# Patient Record
Sex: Female | Born: 1965 | Race: White | Hispanic: No | Marital: Single | Smoking: Never smoker
Health system: Southern US, Community
[De-identification: ages and names within clinical notes are randomized; demographics above are authoritative.]

## PROBLEM LIST (undated history)

## (undated) DIAGNOSIS — Z800 Family history of malignant neoplasm of digestive organs: Secondary | ICD-10-CM

## (undated) DIAGNOSIS — L570 Actinic keratosis: Secondary | ICD-10-CM

## (undated) DIAGNOSIS — Z1371 Encounter for nonprocreative screening for genetic disease carrier status: Secondary | ICD-10-CM

## (undated) HISTORY — DX: Actinic keratosis: L57.0

## (undated) HISTORY — DX: Family history of malignant neoplasm of digestive organs: Z80.0

---
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
## 1990-08-28 HISTORY — PX: TUBAL LIGATION: SHX77

## 2007-10-02 ENCOUNTER — Ambulatory Visit: Payer: Self-pay | Admitting: Unknown Physician Specialty

## 2009-12-22 ENCOUNTER — Ambulatory Visit: Payer: Self-pay | Admitting: Unknown Physician Specialty

## 2011-01-31 ENCOUNTER — Ambulatory Visit: Payer: Self-pay | Admitting: Unknown Physician Specialty

## 2011-02-14 ENCOUNTER — Ambulatory Visit: Payer: Self-pay | Admitting: Unknown Physician Specialty

## 2012-03-15 ENCOUNTER — Ambulatory Visit: Payer: Self-pay | Admitting: Unknown Physician Specialty

## 2015-04-05 ENCOUNTER — Encounter: Payer: Self-pay | Admitting: Physical Therapy

## 2015-04-05 ENCOUNTER — Ambulatory Visit: Payer: BC Managed Care – PPO | Attending: Nurse Practitioner | Admitting: Physical Therapy

## 2015-04-05 VITALS — BP 122/88

## 2015-04-05 DIAGNOSIS — Z7409 Other reduced mobility: Secondary | ICD-10-CM | POA: Insufficient documentation

## 2015-04-05 DIAGNOSIS — R279 Unspecified lack of coordination: Secondary | ICD-10-CM | POA: Insufficient documentation

## 2015-04-05 DIAGNOSIS — M791 Myalgia: Secondary | ICD-10-CM | POA: Diagnosis present

## 2015-04-05 DIAGNOSIS — M609 Myositis, unspecified: Secondary | ICD-10-CM | POA: Diagnosis present

## 2015-04-05 DIAGNOSIS — IMO0001 Reserved for inherently not codable concepts without codable children: Secondary | ICD-10-CM

## 2015-04-05 NOTE — Patient Instructions (Signed)
Bring in a profile pic of work station to next visit.                   Modify car seat with towels.                          Lifting on exhale with squat position.                                  Preserve the function of your pelvic floor, abdomen, and back.              Avoid decreased straining of abdominal/pelvic floor muscles with less  slouching,  holding your breath with lifting/bowel movements)                                                     FUNCTIONAL POSTURES

## 2015-04-05 NOTE — Therapy (Signed)
[place] MAIN [facility] SERVICES [address] [place], [place], [address] Phone: [phone]   Fax:  [phone]  Physical Therapy Evaluation  Patient Details  Name: Michele Rodriguez MRN: [id number] Date of Birth: 02-05-1966 Referring Provider:  Josie Saunders, NP  Encounter Date: 04/05/2015      PT End of Session - 04/05/15 1243    Visit Number 1   Number of Visits 12   Date for PT Re-Evaluation 06/28/15   PT Start Time 1117   PT Stop Time 1220   PT Time Calculation (min) 63 min   Activity Tolerance Patient tolerated treatment well;No increased pain   Behavior During Therapy [facility] for tasks assessed/performed      History reviewed. No pertinent past medical history.  Past Surgical History  Procedure Laterality Date  . Foot surgery Bilateral 1983    to align big toe, pt states it might been "bunion surgery"   . Knee arthroscopy Bilateral 1983    "to align knee cap" pt states  . Tubal ligation  1992    Filed Vitals:   04/05/15 1135  BP: 122/88    Visit Diagnosis:  Lack of coordination - Plan: PT plan of care cert/re-cert  Mobility impaired - Plan: PT plan of care cert/re-cert  Myalgia and myositis - Plan: PT plan of care cert/re-cert      Subjective Assessment - 04/05/15 1135    Subjective Pt began experiencing urinary leakage with sneezing 2 years ago. Pt states it is not worrisome to her because it occurs 1-2x/year. Pt does not wear pads. Pt used to be into body building and doing lots of sit-ups (150/day, 6 days/week) for over 10 years. Pt had stopped working out due to prioritizing her time to home maintenance (yard work). Pt's FT job is sedentary and she is sitting 10 hrs /day including a 2 hr roundtrip commute. 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 Daily bowel movements w/ Type 3-4 on Bristol Scale.  Daily intake: 1-2 coffee/day, 2 (16 fl oz )  water/day. Denied urinary frequency, urgency but does cater to toileting trips around meetings.Denied dyspareunia.       Pertinent History Hx of 2 vaginal births, Hx of falls on coccyx as a child.   Currently in Pain? No/denies            [facility] PT Assessment - 04/05/15 1152    Assessment   Medical Diagnosis SUI   Precautions   Precautions None   Restrictions   Weight Bearing Restrictions No   Balance Screen   Has the patient fallen in the past 6 months No   Coordination   Gross Motor Movements are Fluid and Coordinated --  lumbopelvic instability w/ ASLR,    Fine Motor Movements are Fluid and Coordinated --  abdominal straining w/ cue for bowel movement and "kegel"   Squat   Comments w/ lifting with breathholding,  narrow BOS    Posture/Postural Control   Posture Comments reclined and crossed legged  diaphragmatic breathing/ pelvic floor ROM achieved   ROM / Strength   AROM / PROM / Strength --  hip abd 4/5 bil, 5/5 w/ deep core engaged   AROM   Overall AROM  --  WFL    Palpation   Spinal mobility 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   increased mm tensions upper back   SI assessment  no asymmetries  Bed Mobility   Bed Mobility --  crunch technique, ablet to demo log rolling w/ minor cuing                   [facility] Adult PT Treatment/Exercise - 04/05/15 1152    Self-Care   Self-Care --  towel modification in car seat   Therapeutic Activites    Therapeutic Activities --  lifting bmechanics/ exhale 3 reps, log rolling, toileting   Neuro Re-ed    Neuro Re-ed Details  deep core system pre activation in functional activities, proper sitting posture   Manual Therapy   Joint Mobilization grade III PA on SP T5-6   no tenderness noted, increased mobility post-Tx   Soft tissue mobilization on midback mm   decreased tensions post Tx                PT Education - 04/05/15 1241    Education provided Yes   Education Details HEP, POC, anatomy/physiology  with pcitures shown, body mechanics, ways to decreased downward pressure on pelvic floor    Person(s) Educated Patient   Methods Explanation;Demonstration;Tactile cues;Verbal cues;Handout   Comprehension Verbalized understanding;Returned demonstration             PT Long Term Goals - 04/05/15 1250    PT LONG TERM GOAL #1   Title Pt will demo decreased upper/mid back tensions and increased mobility of thoracic segments across 2 visits in order to apply proper use of deep core mm for lifting heavy objects.    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   Time 12   Period Weeks   Status New   PT LONG TERM GOAL #3   Title Pt will demo proper sitting posture & logrolling technique without cuing across 2 visits in order to demo IND with proper alignment and posture to minimize worsening of Sx.    Time 12   Period Weeks   Status New   PT LONG TERM GOAL #4   Title Pt will have a increased score from 91% to > 95% in order to demo improved QOL.    Time 12   Period Weeks   Status New               Plan - 04/05/15 1244    Clinical Impression Statement Pt is a 49 yo female who c/o SUI and has S & Sx that are consistent w/ poor coordination of deep core mm, poor toileting/ lifting/ OOB/sitting body mechanics that predispose downward forces onto pelvic floor mm. Pt also demo weak hip strength, increased upper back mm tensions, decreased thoracic segmental mobility. These deficits impact her job duties and predisposes her to higher risk for injuries at home and at work.    Pt will benefit from skilled therapeutic intervention in order to improve on the following deficits Decreased coordination;Decreased activity tolerance;Decreased strength;Increased muscle spasms;Improper body mechanics;Postural  dysfunction;Hypomobility;Decreased endurance;Decreased range of motion;Decreased mobility;Decreased knowledge of precautions   Rehab Potential Good   Clinical Impairments Affecting Rehab Potential Long hx of performing repetitive sit-ups, sedentary lifestyle   PT Frequency 1x / week   PT Duration 12 weeks   PT Treatment/Interventions ADLs/Self Care Home Management;Biofeedback;Cryotherapy;Electrical Stimulation;Iontophoresis 4mg /ml Dexamethasone;Moist Heat;Traction;Prosthetic Training;Gait training;Stair training;Functional mobility training;Therapeutic activities;Therapeutic exercise;Balance training;Neuromuscular re-education;Patient/family  education;Dry needling;Energy conservation;Manual techniques   PT Next Visit Plan intravaginal assessment, dynamic stabilization   Consulted and Agree with Plan of Care Patient         Problem List There are no active problems to display for this patient.   Jerl Mina  ,PT, DPT, E-RYT  04/05/2015, 1:02 PM  [place] MAIN [facility] SERVICES [address] [place], [place], [address] Phone: [phone]   Fax:  [phone]

## 2015-04-16 ENCOUNTER — Ambulatory Visit: Payer: BC Managed Care – PPO | Admitting: Physical Therapy

## 2015-04-23 ENCOUNTER — Ambulatory Visit: Payer: BC Managed Care – PPO | Admitting: Physical Therapy

## 2015-04-26 NOTE — Therapy (Signed)
[place] MAIN [facility] SERVICES [address] [place], [place], [address] Phone: [phone]   Fax:  [phone]  Patient Details  Name: Michele Rodriguez MRN: [id number] Date of Birth: 12/08/1965 Referring Provider:  Josie Saunders, NP  Encounter Date: 04/21/2015  Since eval, pt cancelled one appt and no showed to her last appt. PT called pt on 04/26/15 and pt feels she is not interested in pursuing PT at this time due to finances and other medical appts.  PT provided a resource to a book and an exercise program for deep core strengthening exercises that may be more cost-effective options for pt.  (Baby Bod by Brendolyn Patty and [email or web address])   Thank you for your referral!    Jerl Mina ,PT, DPT, E-RYT  04/26/2015, 4:00 PM  [place] MAIN [facility] SERVICES [address] [place], [place], [address] Phone: [phone]   Fax:  [phone]

## 2015-04-30 ENCOUNTER — Encounter: Payer: BC Managed Care – PPO | Admitting: Physical Therapy

## 2015-05-07 ENCOUNTER — Encounter: Payer: BC Managed Care – PPO | Admitting: Physical Therapy

## 2015-05-13 ENCOUNTER — Encounter: Payer: Self-pay | Admitting: *Deleted

## 2015-05-14 ENCOUNTER — Encounter: Payer: BC Managed Care – PPO | Admitting: Physical Therapy

## 2015-05-14 ENCOUNTER — Ambulatory Visit: Payer: BC Managed Care – PPO | Admitting: Anesthesiology

## 2015-05-14 ENCOUNTER — Encounter
Admission: RE | Disposition: A | Discharge status: Home / Self Care | Payer: Self-pay | Source: Ambulatory Visit | Attending: Gastroenterology

## 2015-05-14 ENCOUNTER — Ambulatory Visit
Admission: RE | Admit: 2015-05-14 | Discharge: 2015-05-14 | Disposition: A | Discharge status: Home / Self Care | Payer: BC Managed Care – PPO | Source: Ambulatory Visit | Attending: Gastroenterology | Admitting: Gastroenterology

## 2015-05-14 DIAGNOSIS — E039 Hypothyroidism, unspecified: Secondary | ICD-10-CM | POA: Insufficient documentation

## 2015-05-14 DIAGNOSIS — Z8371 Family history of colonic polyps: Secondary | ICD-10-CM | POA: Insufficient documentation

## 2015-05-14 DIAGNOSIS — D122 Benign neoplasm of ascending colon: Secondary | ICD-10-CM | POA: Diagnosis not present

## 2015-05-14 DIAGNOSIS — Z1211 Encounter for screening for malignant neoplasm of colon: Secondary | ICD-10-CM | POA: Insufficient documentation

## 2015-05-14 DIAGNOSIS — D1779 Benign lipomatous neoplasm of other sites: Secondary | ICD-10-CM | POA: Diagnosis not present

## 2015-05-14 DIAGNOSIS — D127 Benign neoplasm of rectosigmoid junction: Secondary | ICD-10-CM | POA: Insufficient documentation

## 2015-05-14 DIAGNOSIS — Z800 Family history of malignant neoplasm of digestive organs: Secondary | ICD-10-CM | POA: Insufficient documentation

## 2015-05-14 DIAGNOSIS — K640 First degree hemorrhoids: Secondary | ICD-10-CM | POA: Diagnosis not present

## 2015-05-14 DIAGNOSIS — K5730 Diverticulosis of large intestine without perforation or abscess without bleeding: Secondary | ICD-10-CM | POA: Insufficient documentation

## 2015-05-14 HISTORY — PX: COLONOSCOPY WITH PROPOFOL: SHX5780

## 2015-05-14 SURGERY — COLONOSCOPY WITH PROPOFOL
Anesthesia: General

## 2015-05-14 MED ORDER — SODIUM CHLORIDE 0.9 % IV SOLN: INTRAVENOUS | Status: DC

## 2015-05-14 MED ORDER — SODIUM CHLORIDE 0.9 % IV SOLN
INTRAVENOUS | Status: DC
  Administered 2015-05-14: 1000 mL via INTRAVENOUS

## 2015-05-14 MED ORDER — MIDAZOLAM HCL 2 MG/2ML IJ SOLN
INTRAMUSCULAR | Status: DC | PRN
  Administered 2015-05-14: 1 mg via INTRAVENOUS

## 2015-05-14 MED ORDER — PROPOFOL INFUSION 10 MG/ML OPTIME
INTRAVENOUS | Status: DC | PRN
  Administered 2015-05-14: 120 ug/kg/min via INTRAVENOUS

## 2015-05-14 MED ORDER — PROPOFOL 10 MG/ML IV BOLUS
INTRAVENOUS | Status: DC | PRN
  Administered 2015-05-14: 30 mg via INTRAVENOUS

## 2015-05-14 MED ORDER — FENTANYL CITRATE (PF) 100 MCG/2ML IJ SOLN
INTRAMUSCULAR | Status: DC | PRN
  Administered 2015-05-14: 1 ug via INTRAVENOUS

## 2015-05-14 NOTE — Transfer of Care (Signed)
Immediate Anesthesia Transfer of Care Note  Patient: Michele Rodriguez  Procedure(s) Performed: Procedure(s): COLONOSCOPY WITH PROPOFOL (N/A)  Patient Location: PACU  Anesthesia Type:General  Level of Consciousness: awake  Airway & Oxygen Therapy: Patient Spontanous Breathing  Post-op Assessment: Report given to RN  Post vital signs: stable  Last Vitals:  Filed Vitals:   05/14/15 1220  BP: 95/56  Pulse: 62  Temp:   Resp: 15    Complications: No apparent anesthesia complications

## 2015-05-14 NOTE — Op Note (Signed)
[facility] Gastroenterology Patient Name: Michele Rodriguez Procedure Date: 05/14/2015 11:24 AM MRN: [id number] Account #: [id number] Date of Birth: [birth year] Admit Type: Outpatient Age: 49 Room: [facility] ENDO ROOM 3 Gender: Female Note Status: Finalized Procedure:         Colonoscopy Indications:       Colon cancer screening in patient at increased risk:                     Family history of colon polyps, Colon cancer screening in                     patient at increased risk: Colorectal cancer in multiple                     2nd degree relatives, This is the patient's first                     colonoscopy Patient Profile:   This is a 49 year old female. Providers:         Gerrit Heck. Rayann Heman, MD Referring MD:      Dani Gobble Huprich (Referring MD) Medicines:         Propofol per Anesthesia Complications:     No immediate complications. Procedure:         Pre-Anesthesia Assessment:                    - Prior to the procedure, a History and Physical was                     performed, and patient medications, allergies and                     sensitivities were reviewed. The patient's tolerance of                     previous anesthesia was reviewed.                    After obtaining informed consent, the colonoscope was                     passed under direct vision. Throughout the procedure, the                     patient's blood pressure, pulse, and oxygen saturations                     were monitored continuously. The Colonoscope was                     introduced through the anus and advanced to the the cecum,                     identified by appendiceal orifice and ileocecal valve. The                     colonoscopy was performed without difficulty. The patient                     tolerated the procedure well. The quality of the bowel                     preparation was excellent. Findings:      The perianal and digital rectal examinations were normal.  A 10 mm  polyp was found in the proximal ascending colon. The polyp was       flat. The polyp was removed with a saline injection-lift technique using       a hot snare. Resection and retrieval were complete.      A 2 mm polyp was found in the recto-sigmoid colon. The polyp was       sessile. The polyp was removed with a jumbo cold forceps. Resection and       retrieval were complete.      Many small and large-mouthed diverticula were found in the sigmoid       colon, in the descending colon, in the transverse colon and in the       ascending colon.      Internal hemorrhoids were found during retroflexion. The hemorrhoids       were Grade I (internal hemorrhoids that do not prolapse). Impression:        - One 10 mm polyp in the proximal ascending colon.                     Resected and retrieved.                    - One 2 mm polyp at the recto-sigmoid colon. Resected and                     retrieved.                    - Diverticulosis in the sigmoid colon, in the descending                     colon, in the transverse colon and in the ascending colon.                    - Internal hemorrhoids. Recommendation:    - Observe patient in GI recovery unit.                    - High fiber diet.                    - Continue present medications.                    - Await pathology results.                    - Repeat colonoscopy for surveillance based on pathology                     results, likely in 3 years.                    - Return to referring physician.                    - The findings and recommendations were discussed with the                     patient.                    - The findings and recommendations were discussed with the                     patient's family. Procedure Code(s): --- Professional ---  46503, Colonoscopy, flexible; with endoscopic mucosal                     resection                    45380, 66, Colonoscopy, flexible; with biopsy, single or                      multiple CPT copyright 2014 American Medical Association. All rights reserved. The codes documented in this report are preliminary and upon coder review may  be revised to meet current compliance requirements. Mellody Life, MD 05/14/2015 12:06:04 PM This report has been signed electronically. Number of Addenda: 0 Note Initiated On: 05/14/2015 11:24 AM Scope Withdrawal Time: 0 hours 15 minutes 58 seconds  Total Procedure Duration: 0 hours 20 minutes 34 seconds       [facility]

## 2015-05-14 NOTE — H&P (Signed)
  Primary Care Physician:  No PCP Per Patient  Pre-Procedure History & Physical: HPI:  Michele Rodriguez is a 49 y.o. female is here for an colonoscopy.   History reviewed. No pertinent past medical history.  Past Surgical History  Procedure Laterality Date  . Foot surgery Bilateral 1983    to align big toe, pt states it might been "bunion surgery"   . Knee arthroscopy Bilateral 1983    "to align knee cap" pt states  . Tubal ligation  1992    Prior to Admission medications   Medication Sig Start Date End Date Taking? Authorizing Provider  Multiple Vitamin (MULTIVITAMIN) capsule Take 1 capsule by mouth daily.   Yes Historical Provider, MD    Allergies as of 04/21/2015  . (Not on File)    Family History  Problem Relation Age of Onset  . Cancer Father     Social History   Social History  . Marital Status: Married    Spouse Name: N/A  . Number of Children: N/A  . Years of Education: N/A   Occupational History  . Not on file.   Social History Main Topics  . Smoking status: Never Smoker   . Smokeless tobacco: Never Used  . Alcohol Use: 6.6 oz/week    11 Cans of beer per week  . Drug Use: No  . Sexual Activity: Yes    Birth Control/ Protection: Surgical   Other Topics Concern  . Not on file   Social History Narrative     Physical Exam: BP 113/80 mmHg  Pulse 60  Temp(Src) 96.8 F (36 C) (Tympanic)  Resp 18  Ht 5\' 4"  (1.626 m)  Wt 72.576 kg (160 lb)  BMI 27.45 kg/m2  SpO2 100%  LMP  General:   Alert,  pleasant and cooperative in NAD Head:  Normocephalic and atraumatic. Neck:  Supple; no masses or thyromegaly. Lungs:  Clear throughout to auscultation.    Heart:  Regular rate and rhythm. Abdomen:  Soft, nontender and nondistended. Normal bowel sounds, without guarding, and without rebound.   Neurologic:  Alert and  oriented x4;  grossly normal neurologically.  Impression/Plan: Michele Rodriguez is here for an colonoscopy to be performed for screening  Risks,  benefits, limitations, and alternatives regarding  colonoscopy have been reviewed with the patient.  Questions have been answered.  All parties agreeable.   Josefine Class, MD  05/14/2015, 11:35 AM

## 2015-05-14 NOTE — Discharge Instructions (Signed)

## 2015-05-14 NOTE — Anesthesia Postprocedure Evaluation (Signed)
  Anesthesia Post-op Note  Patient: Michele Rodriguez  Procedure(s) Performed: Procedure(s): COLONOSCOPY WITH PROPOFOL (N/A)  Anesthesia type:General  Patient location: PACU  Post pain: Pain level controlled  Post assessment: Post-op Vital signs reviewed, Patient's Cardiovascular Status Stable, Respiratory Function Stable, Patent Airway and No signs of Nausea or vomiting  Post vital signs: Reviewed and stable  Last Vitals:  Filed Vitals:   05/14/15 1230  BP: 115/73  Pulse: 64  Temp:   Resp: 18    Level of consciousness: awake, alert  and patient cooperative  Complications: No apparent anesthesia complications

## 2015-05-14 NOTE — Anesthesia Preprocedure Evaluation (Signed)
Anesthesia Evaluation  Patient identified by MRN, date of birth, ID band Patient awake    Reviewed: Allergy & Precautions, NPO status , Patient's Chart, lab work & pertinent test results  History of Anesthesia Complications Negative for: history of anesthetic complications  Airway Mallampati: II       Dental no notable dental hx.    Pulmonary neg pulmonary ROS,    Pulmonary exam normal        Cardiovascular negative cardio ROS Normal cardiovascular exam     Neuro/Psych    GI/Hepatic Neg liver ROS,   Endo/Other  Hypothyroidism   Renal/GU negative Renal ROS     Musculoskeletal negative musculoskeletal ROS (+)   Abdominal Normal abdominal exam  (+)   Peds negative pediatric ROS (+)  Hematology negative hematology ROS (+)   Anesthesia Other Findings   Reproductive/Obstetrics negative OB ROS                             Anesthesia Physical Anesthesia Plan  ASA: II  Anesthesia Plan: General   Post-op Pain Management:    Induction: Intravenous  Airway Management Planned: Nasal Cannula  Additional Equipment:   Intra-op Plan:   Post-operative Plan:   Informed Consent: I have reviewed the patients History and Physical, chart, labs and discussed the procedure including the risks, benefits and alternatives for the proposed anesthesia with the patient or authorized representative who has indicated his/her understanding and acceptance.     Plan Discussed with: Surgeon  Anesthesia Plan Comments:         Anesthesia Quick Evaluation

## 2015-05-15 ENCOUNTER — Encounter: Payer: Self-pay | Admitting: Gastroenterology

## 2015-05-17 LAB — SURGICAL PATHOLOGY

## 2015-05-21 ENCOUNTER — Encounter: Payer: BC Managed Care – PPO | Admitting: Physical Therapy

## 2015-06-28 DIAGNOSIS — D126 Benign neoplasm of colon, unspecified: Secondary | ICD-10-CM | POA: Insufficient documentation

## 2018-01-22 ENCOUNTER — Encounter: Payer: Self-pay | Admitting: Obstetrics and Gynecology

## 2018-01-22 ENCOUNTER — Ambulatory Visit (INDEPENDENT_AMBULATORY_CARE_PROVIDER_SITE_OTHER): Payer: BC Managed Care – PPO | Admitting: Obstetrics and Gynecology

## 2018-01-22 VITALS — BP 110/70 | HR 65 | Ht 64.0 in | Wt 149.0 lb

## 2018-01-22 DIAGNOSIS — Z1231 Encounter for screening mammogram for malignant neoplasm of breast: Secondary | ICD-10-CM

## 2018-01-22 DIAGNOSIS — Z124 Encounter for screening for malignant neoplasm of cervix: Secondary | ICD-10-CM | POA: Diagnosis not present

## 2018-01-22 DIAGNOSIS — Z01419 Encounter for gynecological examination (general) (routine) without abnormal findings: Secondary | ICD-10-CM | POA: Diagnosis not present

## 2018-01-22 DIAGNOSIS — Z0000 Encounter for general adult medical examination without abnormal findings: Secondary | ICD-10-CM

## 2018-01-22 DIAGNOSIS — Z1239 Encounter for other screening for malignant neoplasm of breast: Secondary | ICD-10-CM

## 2018-01-22 NOTE — Progress Notes (Signed)
Gynecology Annual Exam  PCP: Michele Rodriguez, No Pcp Per  Chief Complaint:  Chief Complaint  Michele Rodriguez presents with  . Gynecologic Exam    History of Present Illness:Michele Rodriguez is a 52 y.o. G2P2002 presents for annual exam. The Michele Rodriguez has no complaints today.   Postmenopausal, no bleeding in 2 years  The Michele Rodriguez is sexually active. She denies dyspareunia.  The Michele Rodriguez does perform self breast exams.  There is notable family history of breast or ovarian cancer in her family. She does have a notable history of colon and pancreatic cancer  The Michele Rodriguez wears seatbelts: yes.   The Michele Rodriguez has regular exercise: not asked.    The Michele Rodriguez denies current symptoms of depression.     Review of Systems: ROS  Past Medical History:  History reviewed. No pertinent past medical history.  Past Surgical History:  Past Surgical History:  Procedure Laterality Date  . COLONOSCOPY WITH PROPOFOL N/A 05/14/2015   Procedure: COLONOSCOPY WITH PROPOFOL;  Surgeon: Josefine Class, MD;  Location: [facility] END[facility]COPY;  Service: Endoscopy;  Laterality: N/A;  . FOOT SURGERY Bilateral 1983   to align big toe, pt states it might been "bunion surgery"   . KNEE ARTHROSCOPY Bilateral 1983   "to align knee cap" pt states  . TUBAL LIGATION  1992    Gynecologic History:  No LMP recorded. Last Pap: Results were: 2014 NIL and HR HPV negative  Last mammogram: 2016 Results were: BI-RAD I  Obstetric History: [id number]  Family History:  Family History  Problem Relation Age of Onset  . Cancer Father   . Diabetes Father   . Hypotension Father   . Prostate cancer Father   . Lung cancer Sister   . Colon cancer Paternal Grandfather   . Pancreatic cancer Maternal Aunt   . Colon cancer Maternal Uncle   . Colon cancer Maternal Grandfather     Social History:  Social History   Socioeconomic History  . Marital status: Married    Spouse name: Not on file  . Number of children: Not on file  . Years of education: Not  on file  . Highest education level: Not on file  Occupational History  . Not on file  Social Needs  . Financial resource strain: Not on file  . Food insecurity:    Worry: Not on file    Inability: Not on file  . Transportation needs:    Medical: Not on file    Non-medical: Not on file  Tobacco Use  . Smoking status: Never Smoker  . Smokeless tobacco: Never Used  Substance and Sexual Activity  . Alcohol use: Yes    Alcohol/week: 6.6 oz    Types: 11 Cans of beer per week  . Drug use: No  . Sexual activity: Yes    Birth control/protection: Surgical  Lifestyle  . Physical activity:    Days per week: Not on file    Minutes per session: Not on file  . Stress: Not on file  Relationships  . Social connections:    Talks on phone: Not on file    Gets together: Not on file    Attends religious service: Not on file    Active member of club or organization: Not on file    Attends meetings of clubs or organizations: Not on file    Relationship status: Not on file  . Intimate partner violence:    Fear of current or ex partner: Not on file    Emotionally abused:  Not on file    Physically abused: Not on file    Forced sexual activity: Not on file  Other Topics Concern  . Not on file  Social History Narrative  . Not on file    Allergies:  No Known Allergies  Medications: Prior to Admission medications   Medication Sig Start Date End Date Taking? Authorizing Provider  Multiple Vitamin (MULTIVITAMIN) capsule Take 1 capsule by mouth daily.   Yes [provider]  Multiple Vitamin (MULTIVITAMIN) capsule Take by mouth.   Yes [provider]  Na Sulfate-K Sulfate-Mg Sulf 17.5-3.13-1.6 GM/177ML SOLN Take as directed for colon prep. 04/19/15  Yes [provider]    Physical Exam Vitals: Blood pressure 110/70, pulse 65, height 5\' 4"  (1.626 m), weight 149 lb (67.6 kg).  General: NAD HEENT: normocephalic, anicteric Thyroid: no enlargement, no palpable  nodules Pulmonary: No increased work of breathing, CTAB Cardiovascular: RRR, distal pulses 2+ Breast: Breast symmetrical, no tenderness, no palpable nodules or masses, no skin or nipple retraction present, no nipple discharge.  No axillary or supraclavicular lymphadenopathy. Abdomen: NABS, soft, non-tender, non-distended.  Umbilicus without lesions.  No hepatomegaly, splenomegaly or masses palpable. No evidence of hernia  Genitourinary:  External: Normal external female genitalia.  Normal urethral meatus, normal Bartholin's and Skene's glands.    Vagina: Normal vaginal mucosa, no evidence of prolapse.    Cervix: Grossly normal in appearance, no bleeding  Uterus: Non-enlarged, mobile, normal contour.  No CMT  Adnexa: ovaries non-enlarged, no adnexal masses  Rectal: deferred  Lymphatic: no evidence of inguinal lymphadenopathy Extremities: no edema, erythema, or tenderness Neurologic: Grossly intact Psychiatric: mood appropriate, affect full  Female chaperone present for pelvic and breast  portions of the physical exam     Assessment: 52 y.o. G2P2002 routine annual exam  Plan: Problem List Items Addressed This Visit    None    Visit Diagnoses    Cervical cancer screening    -  Primary   Encounter for cervical Pap smear with pelvic exam       Encounter for screening breast examination       Health care maintenance          1) Mammogram - recommend yearly screening mammogram.  Mammogram Was ordered today  2) STI screening  was offered and declined  3) ASCCP guidelines and rational discussed.  Michele Rodriguez opts for every 3 years screening interval  4) Osteoporosis  - per USPTF routine screening DEXA at age 54 - FRAX 58 year major fracture risk 5.8%,  10 year hip fracture risk 0.4%  Consider FDA-approved medical therapies in postmenopausal women and men aged 24 years and older, based on the following: a) A hip or vertebral (clinical or morphometric) fracture b) T-score ? -2.5 at  the femoral neck or spine after appropriate evaluation to exclude secondary causes C) Low bone mass (T-score between -1.0 and -2.5 at the femoral neck or spine) and a 10-year probability of a hip fracture ? 3% or a 10-year probability of a major osteoporosis-related fracture ? 20% based on the [place]-adapted WHO algorithm   5) Routine healthcare maintenance including cholesterol, diabetes screening discussed managed by PCP  6) Colonoscopy is up to date, had one 3 years ago, 2 polyps found, is repeating this year in a few months.  Screening recommended starting at age 61 for average risk individuals, age 16 for individuals deemed at increased risk (including African Americans) and recommended to continue until age 61.  For Michele Rodriguez age 69-85  individualized approach is recommended.  Gold standard screening is via colonoscopy, Cologuard screening is an acceptable alternative for Michele Rodriguez unwilling or unable to undergo colonoscopy.  "Colorectal cancer screening for average?risk adults: 2018 guideline update from the American Cancer Society"[place]: A Cancer Journal for Clinicians: Jan 24, 2017   7) Myrisk screening offered, Michele Rodriguez will follow up if she desires testing.  8) No follow-ups on file.   Adrian Prows MD [facility] 01/22/18 3:52 PM

## 2018-01-25 LAB — PAPIG, HPV, RFX 16/18
HPV, high-risk: NEGATIVE
PAP Smear Comment: 0

## 2018-01-25 NOTE — Progress Notes (Signed)
Please let patient know pap result was normal. Thank you, Dr. Gilman Schmidt

## 2019-05-16 ENCOUNTER — Encounter: Payer: Self-pay | Admitting: Obstetrics and Gynecology

## 2019-05-16 ENCOUNTER — Other Ambulatory Visit: Payer: Self-pay

## 2019-05-16 ENCOUNTER — Ambulatory Visit (INDEPENDENT_AMBULATORY_CARE_PROVIDER_SITE_OTHER): Payer: BC Managed Care – PPO | Admitting: Obstetrics and Gynecology

## 2019-05-16 VITALS — BP 104/62 | HR 53 | Ht 64.0 in | Wt 151.0 lb

## 2019-05-16 DIAGNOSIS — Z0000 Encounter for general adult medical examination without abnormal findings: Secondary | ICD-10-CM

## 2019-05-16 DIAGNOSIS — Z1239 Encounter for other screening for malignant neoplasm of breast: Secondary | ICD-10-CM

## 2019-05-16 DIAGNOSIS — Z01419 Encounter for gynecological examination (general) (routine) without abnormal findings: Secondary | ICD-10-CM | POA: Diagnosis not present

## 2019-05-16 DIAGNOSIS — Z13220 Encounter for screening for lipoid disorders: Secondary | ICD-10-CM

## 2019-05-16 DIAGNOSIS — Z131 Encounter for screening for diabetes mellitus: Secondary | ICD-10-CM

## 2019-05-16 DIAGNOSIS — Z1289 Encounter for screening for malignant neoplasm of other sites: Secondary | ICD-10-CM

## 2019-05-16 DIAGNOSIS — Z800 Family history of malignant neoplasm of digestive organs: Secondary | ICD-10-CM

## 2019-05-16 DIAGNOSIS — Z130 Encounter for screening for diseases of the blood and blood-forming organs and certain disorders involving the immune mechanism: Secondary | ICD-10-CM

## 2019-05-16 DIAGNOSIS — Z1329 Encounter for screening for other suspected endocrine disorder: Secondary | ICD-10-CM

## 2019-05-16 NOTE — Patient Instructions (Addendum)
[ ]   Mammogram [ ]  Fasting Labs [ ]  [place] [employer] Recommended Dietary Allowances for Calcium and Vitamin D  Age (yr) Calcium Recommended Dietary Allowance (mg/day) Vitamin D Recommended Dietary Allowance (international units/day)  9-18 1,300 600  19-50 1,000 600  51-70 1,200 600  71 and older 1,200 800  Data from Institute of Medicine. Dietary reference intakes: calcium, vitamin D. [place], [place]: [employer]; 2011.

## 2019-05-16 NOTE — Progress Notes (Signed)
Gynecology Annual Exam  PCP: Patient, No Pcp Per  Chief Complaint:  Chief Complaint  Patient presents with  . Gynecologic Exam    History of Present Illness:Patient is a 53 y.o. G2P2002 presents for annual exam. The patient has no complaints today.   LMP: No LMP recorded. Patient is postmenopausal. Menarche:not applicable Is postmenopausal. Has not had any vaginal bleeding.  Vasomotor symptoms are very mild  The patient is sexually active. She denies dyspareunia.  The patient does perform self breast exams.  There is no notable family history of breast or ovarian cancer in her family.  The patient wears seatbelts: yes.   The patient has regular exercise: yes.    The patient denies current symptoms of depression.     Review of Systems: ROS  Past Medical History:  History reviewed. No pertinent past medical history.  Past Surgical History:  Past Surgical History:  Procedure Laterality Date  . COLONOSCOPY WITH PROPOFOL N/A 05/14/2015   Procedure: COLONOSCOPY WITH PROPOFOL;  Surgeon: Josefine Class, MD;  Location: [facility] ENDOSCOPY;  Service: Endoscopy;  Laterality: N/A;  . FOOT SURGERY Bilateral 1983   to align big toe, pt states it might been "bunion surgery"   . KNEE ARTHROSCOPY Bilateral 1983   "to align knee cap" pt states  . TUBAL LIGATION  1992    Gynecologic History:  No LMP recorded. Patient is postmenopausal. Last Pap: Results were: NIL 2019  Last mammogram: Unknown  Obstetric History: G2P2002  Family History:  Family History  Problem Relation Age of Onset  . Cancer Father   . Diabetes Father   . Hypotension Father   . Prostate cancer Father   . Lung cancer Sister   . Colon cancer Paternal Grandfather   . Pancreatic cancer Maternal Aunt   . Colon cancer Maternal Uncle   . Colon cancer Maternal Grandfather     Social History:  Social History   Socioeconomic History  . Marital status: Married    Spouse name: Not on file  . Number of  children: Not on file  . Years of education: Not on file  . Highest education level: Not on file  Occupational History  . Not on file  Social Needs  . Financial resource strain: Not on file  . Food insecurity    Worry: Not on file    Inability: Not on file  . Transportation needs    Medical: Not on file    Non-medical: Not on file  Tobacco Use  . Smoking status: Never Smoker  . Smokeless tobacco: Never Used  Substance and Sexual Activity  . Alcohol use: Yes    Alcohol/week: 11.0 standard drinks    Types: 11 Cans of beer per week  . Drug use: No  . Sexual activity: Yes    Birth control/protection: Surgical  Lifestyle  . Physical activity    Days per week: Not on file    Minutes per session: Not on file  . Stress: Not on file  Relationships  . Social Herbalist on phone: Not on file    Gets together: Not on file    Attends religious service: Not on file    Active member of club or organization: Not on file    Attends meetings of clubs or organizations: Not on file    Relationship status: Not on file  . Intimate partner violence    Fear of current or ex partner: Not on file  Emotionally abused: Not on file    Physically abused: Not on file    Forced sexual activity: Not on file  Other Topics Concern  . Not on file  Social History Narrative  . Not on file    Allergies:  No Known Allergies  Medications: Prior to Admission medications   Medication Sig Start Date End Date Taking? Authorizing Provider  Multiple Vitamin (MULTIVITAMIN) capsule Take 1 capsule by mouth daily.    [provider]  Multiple Vitamin (MULTIVITAMIN) capsule Take by mouth.    [provider]  Na Sulfate-K Sulfate-Mg Sulf 17.5-3.13-1.6 GM/177ML SOLN Take as directed for colon prep. 04/19/15   [provider]    Physical Exam Vitals: Blood pressure 104/62, pulse (!) 53, height 5\' 4"  (1.626 m), weight 151 lb (68.5 kg).  General: NAD HEENT: normocephalic,  anicteric Thyroid: no enlargement, no palpable nodules Pulmonary: No increased work of breathing, CTAB Cardiovascular: RRR, distal pulses 2+ Breast: Breast symmetrical, no tenderness, no palpable nodules or masses, no skin or nipple retraction present, no nipple discharge.  No axillary or supraclavicular lymphadenopathy. Abdomen: NABS, soft, non-tender, non-distended.  Umbilicus without lesions.  No hepatomegaly, splenomegaly or masses palpable. No evidence of hernia  Genitourinary:  External: Normal external female genitalia.  Normal urethral meatus, normal Bartholin's and Skene's glands.    Vagina: Normal vaginal mucosa, no evidence of prolapse.    Cervix: Grossly normal in appearance, no bleeding  Uterus: Non-enlarged, mobile, normal contour.  No CMT  Adnexa: ovaries non-enlarged, no adnexal masses  Rectal: deferred  Lymphatic: no evidence of inguinal lymphadenopathy Extremities: no edema, erythema, or tenderness Neurologic: Grossly intact Psychiatric: mood appropriate, affect full  Female chaperone present for pelvic and breast  portions of the physical exam     Assessment: 53 y.o. G2P2002 routine annual exam  Plan: Problem List Items Addressed This Visit    None      1) Mammogram - recommend yearly screening mammogram.  Mammogram was ordered today. Encouraged patient to call for an appointment.  2) STI screening  was notoffered and therefore not obtained  3) ASCCP guidelines and rational discussed.  Patient opts for every 5 years screening interval  4) Osteoporosis  - per USPTF routine screening DEXA at age 54 - FRAX 54 year major fracture risk 5.2,  10 year hip fracture risk 0.3  Consider FDA-approved medical therapies in postmenopausal women and men aged 54 years and older, based on the following: a) A hip or vertebral (clinical or morphometric) fracture b) T-score ? -2.5 at the femoral neck or spine after appropriate evaluation to exclude secondary causes C) Low  bone mass (T-score between -1.0 and -2.5 at the femoral neck or spine) and a 10-year probability of a hip fracture ? 3% or a 10-year probability of a major osteoporosis-related fracture ? 20% based on the [place]-adapted WHO algorithm   5) Routine healthcare maintenance including cholesterol, diabetes screening discussed To return fasting at a later date  6) Colonoscopy completed in 2020, was told next colonoscopy in 5 years.  Screening recommended starting at age 72 for average risk individuals, age 86 for individuals deemed at increased risk (including African Americans) and recommended to continue until age 38.  For patient age 30-85 individualized approach is recommended.  Gold standard screening is via colonoscopy, Cologuard screening is an acceptable alternative for patient unwilling or unable to undergo colonoscopy.  "Colorectal cancer screening for average?risk adults: 2018 guideline update from the American Cancer Society"CA: A Cancer Journal for Clinicians:  Jan 24, 2017   7) Myrisk testing today for family hx of colon cancer  8) Return in about 1 year (around 05/15/2020) for Fasting lab appointment next weel - Annual in 1 year.  Adrian Prows MD [facility] 05/16/2019 4:32 PM

## 2019-05-20 ENCOUNTER — Other Ambulatory Visit: Payer: Self-pay | Admitting: Obstetrics and Gynecology

## 2019-05-20 DIAGNOSIS — Z1231 Encounter for screening mammogram for malignant neoplasm of breast: Secondary | ICD-10-CM

## 2019-05-22 ENCOUNTER — Other Ambulatory Visit: Payer: BC Managed Care – PPO

## 2019-05-22 ENCOUNTER — Other Ambulatory Visit: Payer: Self-pay

## 2019-05-22 DIAGNOSIS — Z13220 Encounter for screening for lipoid disorders: Secondary | ICD-10-CM

## 2019-05-22 DIAGNOSIS — Z1329 Encounter for screening for other suspected endocrine disorder: Secondary | ICD-10-CM

## 2019-05-22 DIAGNOSIS — Z130 Encounter for screening for diseases of the blood and blood-forming organs and certain disorders involving the immune mechanism: Secondary | ICD-10-CM

## 2019-05-22 DIAGNOSIS — Z131 Encounter for screening for diabetes mellitus: Secondary | ICD-10-CM

## 2019-05-22 DIAGNOSIS — Z0000 Encounter for general adult medical examination without abnormal findings: Secondary | ICD-10-CM

## 2019-05-23 LAB — CBC WITH DIFFERENTIAL
Basophils Absolute: 0.1 10*3/uL (ref 0.0–0.2)
Basos: 1 %
EOS (ABSOLUTE): 0.2 10*3/uL (ref 0.0–0.4)
Eos: 6 %
Hematocrit: 41.9 % (ref 34.0–46.6)
Hemoglobin: 14.1 g/dL (ref 11.1–15.9)
Immature Grans (Abs): 0 10*3/uL (ref 0.0–0.1)
Immature Granulocytes: 0 %
Lymphocytes Absolute: 1.2 10*3/uL (ref 0.7–3.1)
Lymphs: 35 %
MCH: 32.2 pg (ref 26.6–33.0)
MCHC: 33.7 g/dL (ref 31.5–35.7)
MCV: 96 fL (ref 79–97)
Monocytes Absolute: 0.3 10*3/uL (ref 0.1–0.9)
Monocytes: 8 %
Neutrophils Absolute: 1.7 10*3/uL (ref 1.4–7.0)
Neutrophils: 50 %
RBC: 4.38 x10E6/uL (ref 3.77–5.28)
RDW: 12.7 % (ref 11.7–15.4)
WBC: 3.5 10*3/uL (ref 3.4–10.8)

## 2019-05-23 LAB — COMPREHENSIVE METABOLIC PANEL
ALT: 13 IU/L (ref 0–32)
AST: 17 IU/L (ref 0–40)
Albumin/Globulin Ratio: 2.3 — ABNORMAL HIGH (ref 1.2–2.2)
Albumin: 4.5 g/dL (ref 3.8–4.9)
Alkaline Phosphatase: 53 IU/L (ref 39–117)
BUN/Creatinine Ratio: 14 (ref 9–23)
BUN: 13 mg/dL (ref 6–24)
Bilirubin Total: 1.2 mg/dL (ref 0.0–1.2)
CO2: 22 mmol/L (ref 20–29)
Calcium: 9.2 mg/dL (ref 8.7–10.2)
Chloride: 103 mmol/L (ref 96–106)
Creatinine, Ser: 0.95 mg/dL (ref 0.57–1.00)
GFR calc Af Amer: 79 mL/min/{1.73_m2} (ref >59)
GFR calc non Af Amer: 68 mL/min/{1.73_m2} (ref >59)
Globulin, Total: 2 g/dL (ref 1.5–4.5)
Glucose: 94 mg/dL (ref 65–99)
Potassium: 4.3 mmol/L (ref 3.5–5.2)
Sodium: 141 mmol/L (ref 134–144)
Total Protein: 6.5 g/dL (ref 6.0–8.5)

## 2019-05-23 LAB — LIPID PANEL
Chol/HDL Ratio: 2.4 ratio (ref 0.0–4.4)
Cholesterol, Total: 186 mg/dL (ref 100–199)
HDL: 76 mg/dL (ref >39)
LDL Chol Calc (NIH): 95 mg/dL (ref 0–99)
Triglycerides: 82 mg/dL (ref 0–149)
VLDL Cholesterol Cal: 15 mg/dL (ref 5–40)

## 2019-05-23 LAB — TSH+FREE T4
Free T4: 1.07 ng/dL (ref 0.82–1.77)
TSH: 2.22 u[IU]/mL (ref 0.450–4.500)

## 2019-05-26 NOTE — Progress Notes (Signed)
WNL

## 2019-05-29 DIAGNOSIS — Z1371 Encounter for nonprocreative screening for genetic disease carrier status: Secondary | ICD-10-CM

## 2019-05-29 HISTORY — DX: Encounter for nonprocreative screening for genetic disease carrier status: Z13.71

## 2019-06-04 ENCOUNTER — Encounter: Payer: Self-pay | Admitting: Obstetrics and Gynecology

## 2019-06-23 ENCOUNTER — Encounter: Payer: Self-pay | Admitting: Obstetrics and Gynecology

## 2019-06-23 ENCOUNTER — Other Ambulatory Visit: Payer: Self-pay

## 2019-06-23 ENCOUNTER — Ambulatory Visit (INDEPENDENT_AMBULATORY_CARE_PROVIDER_SITE_OTHER): Payer: BC Managed Care – PPO | Admitting: Obstetrics and Gynecology

## 2019-06-23 VITALS — BP 130/88 | Wt 154.0 lb

## 2019-06-23 DIAGNOSIS — Z800 Family history of malignant neoplasm of digestive organs: Secondary | ICD-10-CM

## 2019-06-23 NOTE — Progress Notes (Signed)
Patient ID: Michele Rodriguez, female   DOB: Mar 06, 1966, 53 y.o.   MRN: [id number]  Reason for Consult: Follow-up   Referred by No ref. provider found  Subjective:     HPI:  Michele Rodriguez is a 53 y.o. female. She is following up today for a discussion of her Myrisk test result. She is feeling well. No additional complaints.   Past Medical History:  Diagnosis Date  . BRCA negative 05/2019   MyRisk neg; IBIS=8.2%/riskscore=8.0%  . Family history of colon cancer   . Family history of pancreatic cancer    Family History  Problem Relation Age of Onset  . Diabetes Father   . Hypotension Father   . Prostate cancer Father 30  . Lung cancer Sister   . Colon cancer Paternal Grandfather   . Pancreatic cancer Maternal Aunt   . Colon cancer Maternal Grandfather    Past Surgical History:  Procedure Laterality Date  . COLONOSCOPY WITH PROPOFOL N/A 05/14/2015   Procedure: COLONOSCOPY WITH PROPOFOL;  Surgeon: Josefine Class, MD;  Location: [facility] ENDOSCOPY;  Service: Endoscopy;  Laterality: N/A;  . FOOT SURGERY Bilateral 1983   to align big toe, pt states it might been "bunion surgery"   . KNEE ARTHROSCOPY Bilateral 1983   "to align knee cap" pt states  . TUBAL LIGATION  1992    Short Social History:  Social History   Tobacco Use  . Smoking status: Never Smoker  . Smokeless tobacco: Never Used  Substance Use Topics  . Alcohol use: Yes    Alcohol/week: 11.0 standard drinks    Types: 11 Cans of beer per week    No Known Allergies  Current Outpatient Medications  Medication Sig Dispense Refill  . Multiple Vitamin (MULTIVITAMIN) capsule Take 1 capsule by mouth daily.    . Multiple Vitamin (MULTIVITAMIN) capsule Take by mouth.    . Na Sulfate-K Sulfate-Mg Sulf 17.5-3.13-1.6 GM/177ML SOLN Take as directed for colon prep.     No current facility-administered medications for this visit.     Review of Systems  Constitutional: Negative for chills, fatigue, fever and unexpected  weight change.  HENT: Negative for trouble swallowing.  Eyes: Negative for loss of vision.  Respiratory: Negative for cough, shortness of breath and wheezing.  Cardiovascular: Negative for chest pain, leg swelling, palpitations and syncope.  GI: Negative for abdominal pain, blood in stool, diarrhea, nausea and vomiting.  GU: Negative for difficulty urinating, dysuria, frequency and hematuria.  Musculoskeletal: Negative for back pain, leg pain and joint pain.  Skin: Negative for rash.  Neurological: Negative for dizziness, headaches, light-headedness, numbness and seizures.  Psychiatric: Negative for behavioral problem, confusion, depressed mood and sleep disturbance.        Objective:  Objective   Vitals:   06/23/19 1558  BP: 130/88  Weight: 154 lb (69.9 kg)   Body mass index is 26.43 kg/m.  Physical Exam Vitals signs and nursing note reviewed.  Constitutional:      Appearance: She is well-developed.  HENT:     Head: Normocephalic and atraumatic.  Eyes:     Pupils: Pupils are equal, round, and reactive to light.  Cardiovascular:     Rate and Rhythm: Normal rate and regular rhythm.  Pulmonary:     Effort: Pulmonary effort is normal. No respiratory distress.  Skin:    General: Skin is warm and dry.  Neurological:     Mental Status: She is alert and oriented to person, place, and time.  Psychiatric:  Behavior: Behavior normal.        Thought Content: Thought content normal.        Judgment: Judgment normal.        Assessment/Plan:     53 yo with family history of colon and pancreatic cancer. Discussed Mrrisk result with patient. No clinically significant mutation was identified.  Myrisk testing did not identify any genetic propensity for colon cancer.  Patient is currently receiving regular colonoscopies. She most recently had a colonoscopy in 2019 which did not show polyps. Follow up plan was for 5 years.  Prior to this she had a colonoscopy in 2016 where  polyps were removed.  Patient will plan to continue with regular colon cancer screening.   More than 10 minutes were spent face to face with the patient in the room with more than 50% of the time spent providing counseling and discussing the plan of management.      Adrian Prows MD [facility] 06/23/2019 5:51 PM

## 2019-07-14 ENCOUNTER — Ambulatory Visit
Admission: RE | Admit: 2019-07-14 | Discharge: 2019-07-14 | Disposition: A | Discharge status: Home / Self Care | Payer: BC Managed Care – PPO | Source: Ambulatory Visit | Attending: Obstetrics and Gynecology | Admitting: Obstetrics and Gynecology

## 2019-07-14 DIAGNOSIS — Z1231 Encounter for screening mammogram for malignant neoplasm of breast: Secondary | ICD-10-CM | POA: Diagnosis not present

## 2019-07-29 ENCOUNTER — Other Ambulatory Visit: Payer: Self-pay | Admitting: Obstetrics and Gynecology

## 2019-07-29 DIAGNOSIS — R928 Other abnormal and inconclusive findings on diagnostic imaging of breast: Secondary | ICD-10-CM

## 2019-07-29 DIAGNOSIS — N6320 Unspecified lump in the left breast, unspecified quadrant: Secondary | ICD-10-CM

## 2019-07-30 ENCOUNTER — Encounter: Payer: Self-pay | Admitting: Obstetrics and Gynecology

## 2019-07-30 ENCOUNTER — Telehealth: Payer: Self-pay

## 2019-07-30 NOTE — Telephone Encounter (Signed)
Pt calling triage wanting a follow up appt? Said she spoke with CS about this earlier, but I do not see anything in here. Please advise.

## 2019-07-30 NOTE — Telephone Encounter (Signed)
She needs an appointment with [facility] for a breast US and diagnostic mammogram.

## 2019-07-30 NOTE — Progress Notes (Signed)
Called and discussed the result with Maudie Mercury. She will call so she can schedule further imaging.

## 2019-07-30 NOTE — Telephone Encounter (Signed)
Michele Gala, do you schedule these once the order is in from provider?

## 2019-07-31 NOTE — Telephone Encounter (Signed)
Patient aware to contact [facility]

## 2019-08-05 ENCOUNTER — Ambulatory Visit
Admission: RE | Admit: 2019-08-05 | Discharge: 2019-08-05 | Disposition: A | Discharge status: Home / Self Care | Payer: BC Managed Care – PPO | Source: Ambulatory Visit | Attending: Obstetrics and Gynecology | Admitting: Obstetrics and Gynecology

## 2019-08-05 DIAGNOSIS — R928 Other abnormal and inconclusive findings on diagnostic imaging of breast: Secondary | ICD-10-CM

## 2019-08-05 DIAGNOSIS — N6320 Unspecified lump in the left breast, unspecified quadrant: Secondary | ICD-10-CM

## 2019-08-05 NOTE — Progress Notes (Signed)
Called and discussed with patient.  Follow up in 1 year

## 2020-05-17 ENCOUNTER — Other Ambulatory Visit: Payer: Self-pay

## 2020-05-17 ENCOUNTER — Ambulatory Visit (INDEPENDENT_AMBULATORY_CARE_PROVIDER_SITE_OTHER): Payer: BC Managed Care – PPO | Admitting: Obstetrics and Gynecology

## 2020-05-17 ENCOUNTER — Encounter: Payer: Self-pay | Admitting: Obstetrics and Gynecology

## 2020-06-07 ENCOUNTER — Other Ambulatory Visit: Payer: Self-pay

## 2020-06-07 ENCOUNTER — Ambulatory Visit (INDEPENDENT_AMBULATORY_CARE_PROVIDER_SITE_OTHER): Payer: BC Managed Care – PPO | Admitting: Obstetrics and Gynecology

## 2020-06-07 ENCOUNTER — Encounter: Payer: Self-pay | Admitting: Obstetrics and Gynecology

## 2020-06-07 VITALS — BP 115/70 | Ht 64.0 in | Wt 152.4 lb

## 2020-06-07 DIAGNOSIS — Z1329 Encounter for screening for other suspected endocrine disorder: Secondary | ICD-10-CM

## 2020-06-07 DIAGNOSIS — Z1231 Encounter for screening mammogram for malignant neoplasm of breast: Secondary | ICD-10-CM

## 2020-06-07 DIAGNOSIS — Z0000 Encounter for general adult medical examination without abnormal findings: Secondary | ICD-10-CM | POA: Diagnosis not present

## 2020-06-07 DIAGNOSIS — Z01419 Encounter for gynecological examination (general) (routine) without abnormal findings: Secondary | ICD-10-CM

## 2020-06-07 DIAGNOSIS — Z131 Encounter for screening for diabetes mellitus: Secondary | ICD-10-CM

## 2020-06-07 DIAGNOSIS — Z130 Encounter for screening for diseases of the blood and blood-forming organs and certain disorders involving the immune mechanism: Secondary | ICD-10-CM

## 2020-06-07 DIAGNOSIS — R239 Unspecified skin changes: Secondary | ICD-10-CM

## 2020-06-07 DIAGNOSIS — Z13220 Encounter for screening for lipoid disorders: Secondary | ICD-10-CM

## 2020-06-07 DIAGNOSIS — Z1211 Encounter for screening for malignant neoplasm of colon: Secondary | ICD-10-CM

## 2020-06-07 NOTE — Patient Instructions (Signed)
[employer] Recommended Dietary Allowances for Calcium and Vitamin D  Age (yr) Calcium Recommended Dietary Allowance (mg/day) Vitamin D Recommended Dietary Allowance (international units/day)  9-18 1,300 600  19-50 1,000 600  51-70 1,200 600  71 and older 1,200 800  Data from [employer]. Dietary reference intakes: calcium, vitamin D. [place], [place]: [employer] Academies [employer]; 2011.     Exercising to Stay Healthy To become healthy and stay healthy, it is recommended that you do moderate-intensity and vigorous-intensity exercise. You can tell that you are exercising at a moderate intensity if your heart starts beating faster and you start breathing faster but can still hold a conversation. You can tell that you are exercising at a vigorous intensity if you are breathing much harder and faster and cannot hold a conversation while exercising. Exercising regularly is important. It has many health benefits, such as:  Improving overall fitness, flexibility, and endurance.  Increasing bone density.  Helping with weight control.  Decreasing body fat.  Increasing muscle strength.  Reducing stress and tension.  Improving overall health. How often should I exercise? Choose an activity that you enjoy, and set realistic goals. Your health care provider can help you make an activity plan that works for you. Exercise regularly as told by your health care provider. This may include:  Doing strength training two times a week, such as: ? Lifting weights. ? Using resistance bands. ? Push-ups. ? Sit-ups. ? Yoga.  Doing a certain intensity of exercise for a given amount of time. Choose from these options: ? A total of 150 minutes of moderate-intensity exercise every week. ? A total of 75 minutes of vigorous-intensity exercise every week. ? A mix of moderate-intensity and vigorous-intensity exercise every week. Children, pregnant women, people who have not exercised  regularly, people who are overweight, and older adults may need to talk with a health care provider about what activities are safe to do. If you have a medical condition, be sure to talk with your health care provider before you start a new exercise program. What are some exercise ideas? Moderate-intensity exercise ideas include:  Walking 1 mile (1.6 km) in about 15 minutes.  Biking.  Hiking.  Golfing.  Dancing.  Water aerobics. Vigorous-intensity exercise ideas include:  Walking 4.5 miles (7.2 km) or more in about 1 hour.  Jogging or running 5 miles (8 km) in about 1 hour.  Biking 10 miles (16.1 km) or more in about 1 hour.  Lap swimming.  Roller-skating or in-line skating.  Cross-country skiing.  Vigorous competitive sports, such as football, basketball, and soccer.  Jumping rope.  Aerobic dancing. What are some everyday activities that can help me to get exercise?  Yard work, such as: ? Pushing a lawn mower. ? Raking and bagging leaves.  Washing your car.  Pushing a stroller.  Shoveling snow.  Gardening.  Washing windows or floors. How can I be more active in my day-to-day activities?  Use stairs instead of an elevator.  Take a walk during your lunch break.  If you drive, park your car farther away from your work or school.  If you take public transportation, get off one stop early and walk the rest of the way.  Stand up or walk around during all of your indoor phone calls.  Get up, stretch, and walk around every 30 minutes throughout the day.  Enjoy exercise with a friend. Support to continue exercising will help you keep a regular routine of activity. What guidelines can   I follow while exercising?  Before you start a new exercise program, talk with your health care provider.  Do not exercise so much that you hurt yourself, feel dizzy, or get very short of breath.  Wear comfortable clothes and wear shoes with good support.  Drink plenty of  water while you exercise to prevent dehydration or heat stroke.  Work out until your breathing and your heartbeat get faster. Where to find more information  [place] Department of Health and Human Services: www.hhs.gov  Centers for Disease Control and Prevention (CDC): www.cdc.gov Summary  Exercising regularly is important. It will improve your overall fitness, flexibility, and endurance.  Regular exercise also will improve your overall health. It can help you control your weight, reduce stress, and improve your bone density.  Do not exercise so much that you hurt yourself, feel dizzy, or get very short of breath.  Before you start a new exercise program, talk with your health care provider. This information is not intended to replace advice given to you by your health care provider. Make sure you discuss any questions you have with your health care provider. Document Revised: 07/27/2017 Document Reviewed: 07/05/2017 Elsevier Patient Education  2020 [employer].   Budget-Friendly Healthy Eating There are many ways to save money at the grocery store and continue to eat healthy. You can be successful if you:  Plan meals according to your budget.  Make a grocery list and only purchase food according to your grocery list.  Prepare food yourself. What are tips for following this plan?  Reading food labels  Compare food labels between brand name foods and the store brand. Often the nutritional value is the same, but the store brand is lower cost.  Look for products that do not have added sugar, fat, or salt (sodium). These often cost the same but are healthier for you. Products may be labeled as: ? Sugar-free. ? Nonfat. ? Low-fat. ? Sodium-free. ? Low-sodium.  Look for lean ground beef labeled as at least 92% lean and 8% fat. Shopping  Buy only the items on your grocery list and go only to the areas of the store that have the items on your list.  Use coupons only for foods  and brands you normally buy. Avoid buying items you wouldn't normally buy simply because they are on sale.  Check online and in newspapers for weekly deals.  Buy healthy items from the bulk bins when available, such as herbs, spices, flour, pasta, nuts, and dried fruit.  Buy fruits and vegetables that are in season. Prices are usually lower on in-season produce.  Look at the unit price on the price tag. Use it to compare different brands and sizes to find out which item is the best deal.  Choose healthy items that are often low-cost, such as carrots, potatoes, apples, bananas, and oranges. Dried or canned beans are a low-cost protein source.  Buy in bulk and freeze extra food. Items you can buy in bulk include meats, fish, poultry, frozen fruits, and frozen vegetables.  Avoid buying "ready-to-eat" foods, such as pre-cut fruits and vegetables and pre-made salads.  If possible, shop around to discover where you can find the best prices. Consider other retailers such as dollar stores, larger wholesale stores, local fruit and vegetable stands, and farmers markets.  Do not shop when you are hungry. If you shop while hungry, it may be hard to stick to your list and budget.  Resist impulse buying. Use your grocery list as   your official plan for the week.  Buy a variety of vegetables and fruits by purchasing fresh, frozen, and canned items.  Look at the top and bottom shelves for deals. Foods at eye level (eye level of an adult or child) are usually more expensive.  Be efficient with your time when shopping. The more time you spend at the store, the more money you are likely to spend.  To save money when choosing more expensive foods like meats and dairy: ? Choose cheaper cuts of meat, such as bone-in chicken thighs and drumsticks instead of skinless and boneless chicken. When you are ready to prepare the chicken, you can remove the skin yourself to make it healthier. ? Choose lean meats like  chicken or turkey instead of beef. ? Choose canned seafood, such as tuna, salmon, or sardines. ? Buy eggs as a low-cost source of protein. ? Buy dried beans and peas, such as lentils, split peas, or kidney beans instead of meats. Dried beans and peas are a good alternative source of protein. ? Buy the larger tubs of yogurt instead of individual-sized containers.  Choose water instead of sodas and other sweetened beverages.  Avoid buying chips, cookies, and other "junk food." These items are usually expensive and not healthy. Cooking  Make extra food and freeze the extras in meal-sized containers or in individual portions for fast meals and snacks.  Pre-cook on days when you have extra time to prepare meals in advance. You can keep these meals in the fridge or freezer and reheat for a quick meal.  When you come home from the grocery store, wash, peel, and cut fruits and vegetables so they are ready to use and eat. This will help reduce food waste. Meal planning  Do not eat out or get fast food. Prepare food at home.  Make a grocery list and make sure to bring it with you to the store. If you have a smart phone, you could use your phone to create your shopping list.  Plan meals and snacks according to a grocery list and budget you create.  Use leftovers in your meal plan for the week.  Look for recipes where you can cook once and make enough food for two meals.  Include budget-friendly meals like stews, casseroles, and stir-fry dishes.  Try some meatless meals or try "no cook" meals like salads.  Make sure that half your plate is filled with fruits or vegetables. Choose from fresh, frozen, or canned fruits and vegetables. If eating canned, remember to rinse them before eating. This will remove any excess salt added for packaging. Summary  Eating healthy on a budget is possible if you plan your meals according to your budget, purchase according to your budget and grocery list, and  prepare food yourself.  Tips for buying more food on a limited budget include buying generic brands, using coupons only for foods you normally buy, and buying healthy items from the bulk bins when available.  Tips for buying cheaper food to replace expensive food include choosing cheaper, lean cuts of meat, and buying dried beans and peas. This information is not intended to replace advice given to you by your health care provider. Make sure you discuss any questions you have with your health care provider. Document Revised: 08/15/2017 Document Reviewed: 08/15/2017 Elsevier Patient Education  2020 [employer].   Bone Health Bones protect organs, store calcium, anchor muscles, and support the whole body. Keeping your bones strong is important, especially as you   get older. You can take actions to help keep your bones strong and healthy. Why is keeping my bones healthy important?  Keeping your bones healthy is important because your body constantly replaces bone cells. Cells get old, and new cells take their place. As we age, we lose bone cells because the body may not be able to make enough new cells to replace the old cells. The amount of bone cells and bone tissue you have is referred to as bone mass. The higher your bone mass, the stronger your bones. The aging process leads to an overall loss of bone mass in the body, which can increase the likelihood of:  Joint pain and stiffness.  Broken bones.  A condition in which the bones become weak and brittle (osteoporosis). A large decline in bone mass occurs in older adults. In women, it occurs about the time of menopause. What actions can I take to keep my bones healthy? Good health habits are important for maintaining healthy bones. This includes eating nutritious foods and exercising regularly. To have healthy bones, you need to get enough of the right minerals and vitamins. Most nutrition experts recommend getting these nutrients from the  foods that you eat. In some cases, taking supplements may also be recommended. Doing certain types of exercise is also important for bone health. What are the nutritional recommendations for healthy bones?  Eating a well-balanced diet with plenty of calcium and vitamin D will help to protect your bones. Nutritional recommendations vary from person to person. Ask your health care provider what is healthy for you. Here are some general guidelines. Get enough calcium Calcium is the most important (essential) mineral for bone health. Most people can get enough calcium from their diet, but supplements may be recommended for people who are at risk for osteoporosis. Good sources of calcium include:  Dairy products, such as low-fat or nonfat milk, cheese, and yogurt.  Dark green leafy vegetables, such as bok choy and broccoli.  Calcium-fortified foods, such as orange juice, cereal, bread, soy beverages, and tofu products.  Nuts, such as almonds. Follow these recommended amounts for daily calcium intake:  Children, age 1-3: 700 mg.  Children, age 4-8: 1,000 mg.  Children, age 9-13: 1,300 mg.  Teens, age 14-18: 1,300 mg.  Adults, age 19-50: 1,000 mg.  Adults, age 51-70: ? Men: 1,000 mg. ? Women: 1,200 mg.  Adults, age 71 or older: 1,200 mg.  Pregnant and breastfeeding females: ? Teens: 1,300 mg. ? Adults: 1,000 mg. Get enough vitamin D Vitamin D is the most essential vitamin for bone health. It helps the body absorb calcium. Sunlight stimulates the skin to make vitamin D, so be sure to get enough sunlight. If you live in a cold climate or you do not get outside often, your health care provider may recommend that you take vitamin D supplements. Good sources of vitamin D in your diet include:  Egg yolks.  Saltwater fish.  Milk and cereal fortified with vitamin D. Follow these recommended amounts for daily vitamin D intake:  Children and teens, age 1-18: 600 international  units.  Adults, age 50 or younger: 400-800 international units.  Adults, age 51 or older: 800-1,000 international units. Get other important nutrients Other nutrients that are important for bone health include:  Phosphorus. This mineral is found in meat, poultry, dairy foods, nuts, and legumes. The recommended daily intake for adult men and adult women is 700 mg.  Magnesium. This mineral is found in seeds, nuts, dark   green vegetables, and legumes. The recommended daily intake for adult men is 400-420 mg. For adult women, it is 310-320 mg.  Vitamin K. This vitamin is found in green leafy vegetables. The recommended daily intake is 120 mg for adult men and 90 mg for adult women. What type of physical activity is best for building and maintaining healthy bones? Weight-bearing and strength-building activities are important for building and maintaining healthy bones. Weight-bearing activities cause muscles and bones to work against gravity. Strength-building activities increase the strength of the muscles that support bones. Weight-bearing and muscle-building activities include:  Walking and hiking.  Jogging and running.  Dancing.  Gym exercises.  Lifting weights.  Tennis and racquetball.  Climbing stairs.  Aerobics. Adults should get at least 30 minutes of moderate physical activity on most days. Children should get at least 60 minutes of moderate physical activity on most days. Ask your health care provider what type of exercise is best for you. How can I find out if my bone mass is low? Bone mass can be measured with an X-ray test called a bone mineral density (BMD) test. This test is recommended for all women who are age 65 or older. It may also be recommended for:  Men who are age 70 or older.  People who are at risk for osteoporosis because of: ? Having bones that break easily. ? Having a long-term disease that weakens bones, such as kidney disease or rheumatoid  arthritis. ? Having menopause earlier than normal. ? Taking medicine that weakens bones, such as steroids, thyroid hormones, or hormone treatment for breast cancer or prostate cancer. ? Smoking. ? Drinking three or more alcoholic drinks a day. If you find that you have a low bone mass, you may be able to prevent osteoporosis or further bone loss by changing your diet and lifestyle. Where can I find more information? For more information, check out the following websites:  National Osteoporosis Foundation: www.nof.org/patients  National Institutes of Health: www.bones.nih.gov  International Osteoporosis Foundation: www.iofbonehealth.org Summary  The aging process leads to an overall loss of bone mass in the body, which can increase the likelihood of broken bones and osteoporosis.  Eating a well-balanced diet with plenty of calcium and vitamin D will help to protect your bones.  Weight-bearing and strength-building activities are also important for building and maintaining strong bones.  Bone mass can be measured with an X-ray test called a bone mineral density (BMD) test. This information is not intended to replace advice given to you by your health care provider. Make sure you discuss any questions you have with your health care provider. Document Revised: 09/10/2017 Document Reviewed: 09/10/2017 Elsevier Patient Education  2020 [employer].   

## 2020-06-07 NOTE — Progress Notes (Signed)
Gynecology Annual Exam  PCP: Patient, No Pcp Per  Chief Complaint:  Chief Complaint  Patient presents with  . Gynecologic Exam    annual exam    History of Present Illness: Patient is a 54 y.o. [id number] presents for annual exam. The patient has no complaints today.   LMP: No LMP recorded. Patient is postmenopausal. Denies postmenopausal bleeding  The patient is sexually active. She currently uses none for contraception. She denies dyspareunia.  The patient does perform self breast exams.  There is no notable family history of breast or ovarian cancer in her family.   The patient has regular exercise: yes 2-4 times a week.    The patient denies current symptoms of depression.    Review of Systems: Review of Systems  Constitutional: Negative for chills, fever, malaise/fatigue and weight loss.  HENT: Negative for congestion, hearing loss and sinus pain.   Eyes: Negative for blurred vision and double vision.  Respiratory: Negative for cough, sputum production, shortness of breath and wheezing.   Cardiovascular: Negative for chest pain, palpitations, orthopnea and leg swelling.  Gastrointestinal: Negative for abdominal pain, constipation, diarrhea, nausea and vomiting.  Genitourinary: Negative for dysuria, flank pain, frequency, hematuria and urgency.  Musculoskeletal: Negative for back pain, falls and joint pain.  Skin: Negative for itching and rash.  Neurological: Negative for dizziness and headaches.  Psychiatric/Behavioral: Negative for depression, substance abuse and suicidal ideas. The patient is not nervous/anxious.     Past Medical History:  Patient Active Problem List   Diagnosis Date Noted  . Tubular adenoma of colon 06/28/2015    Past Surgical History:  Past Surgical History:  Procedure Laterality Date  . COLONOSCOPY WITH PROPOFOL N/A 05/14/2015   Procedure: COLONOSCOPY WITH PROPOFOL;  Surgeon: Josefine Class, MD;  Location: [facility] ENDOSCOPY;  Service:  Endoscopy;  Laterality: N/A;  . FOOT SURGERY Bilateral 1983   to align big toe, pt states it might been "bunion surgery"   . KNEE ARTHROSCOPY Bilateral 1983   "to align knee cap" pt states  . TUBAL LIGATION  1992    Gynecologic History:  No LMP recorded. Patient is postmenopausal. Contraception: none Last Pap: Results were: 2019 NIL and HR HPV negative  Last mammogram: 2020 Results were: BI-RAD I  Obstetric History: [id number]  Family History:  Family History  Problem Relation Age of Onset  . Diabetes Father   . Hypotension Father   . Prostate cancer Father 83  . Lung cancer Sister   . Colon cancer Paternal Grandfather   . Pancreatic cancer Maternal Aunt   . Colon cancer Maternal Grandfather     Social History:  Social History   Socioeconomic History  . Marital status: Single    Spouse name: Not on file  . Number of children: Not on file  . Years of education: Not on file  . Highest education level: Not on file  Occupational History  . Not on file  Tobacco Use  . Smoking status: Never Smoker  . Smokeless tobacco: Never Used  Vaping Use  . Vaping Use: Never used  Substance and Sexual Activity  . Alcohol use: Yes    Alcohol/week: 11.0 standard drinks    Types: 11 Cans of beer per week  . Drug use: No  . Sexual activity: Yes    Birth control/protection: Surgical  Other Topics Concern  . Not on file  Social History Narrative  . Not on file   Social Determinants of Health   Financial Resource  Strain:   . Difficulty of Paying Living Expenses: Not on file  Food Insecurity:   . Worried About Charity fundraiser in the Last Year: Not on file  . Ran Out of Food in the Last Year: Not on file  Transportation Needs:   . Lack of Transportation (Medical): Not on file  . Lack of Transportation (Non-Medical): Not on file  Physical Activity:   . Days of Exercise per Week: Not on file  . Minutes of Exercise per Session: Not on file  Stress:   . Feeling of Stress : Not  on file  Social Connections:   . Frequency of Communication with Friends and Family: Not on file  . Frequency of Social Gatherings with Friends and Family: Not on file  . Attends Religious Services: Not on file  . Active Member of Clubs or Organizations: Not on file  . Attends Archivist Meetings: Not on file  . Marital Status: Not on file  Intimate Partner Violence:   . Fear of Current or Ex-Partner: Not on file  . Emotionally Abused: Not on file  . Physically Abused: Not on file  . Sexually Abused: Not on file    Allergies:  No Known Allergies  Medications: Prior to Admission medications   Medication Sig Start Date End Date Taking? Authorizing Provider  Multiple Vitamin (MULTIVITAMIN) capsule Take by mouth.   Yes [provider]  Na Sulfate-K Sulfate-Mg Sulf 17.5-3.13-1.6 GM/177ML SOLN Take as directed for colon prep. 04/19/15  Yes [provider]    Physical Exam Vitals: Blood pressure 115/70, height 5\' 4"  (1.626 m), weight 152 lb 6.4 oz (69.1 kg).  General: NAD HEENT: normocephalic, anicteric Thyroid: no enlargement, no palpable nodules Pulmonary: No increased work of breathing, CTAB Cardiovascular: RRR, distal pulses 2+ Breast: Breast symmetrical, no tenderness, no palpable nodules or masses, no skin or nipple retraction present, no nipple discharge.  No axillary or supraclavicular lymphadenopathy. Abdomen: NABS, soft, non-tender, non-distended.  Umbilicus without lesions.  No hepatomegaly, splenomegaly or masses palpable. No evidence of hernia  Genitourinary:  External: Normal external female genitalia.  Normal urethral meatus, normal Bartholin's and Skene's glands.    Vagina: Normal vaginal mucosa, no evidence of prolapse.    Cervix: Grossly normal in appearance, no bleeding  Uterus: Non-enlarged, mobile, normal contour.  No CMT  Adnexa: ovaries non-enlarged, no adnexal masses  Rectal: deferred  Lymphatic: no evidence of inguinal  lymphadenopathy Extremities: no edema, erythema, or tenderness Neurologic: Grossly intact Psychiatric: mood appropriate, affect full  Female chaperone present for pelvic and breast  portions of the physical exam  Physical Exam Skin:          Assessment: 54 y.o. G2P2002 routine annual exam  Plan: Problem List Items Addressed This Visit    None    Visit Diagnoses    Screening cholesterol level    -  Primary   Relevant Orders   Comprehensive metabolic panel   Lipid panel   Health maintenance examination       Encounter for annual routine gynecological examination       Breast cancer screening by mammogram       Relevant Orders   Comprehensive metabolic panel   Screening for diabetes mellitus       Screening for deficiency anemia       Relevant Orders   CBC With Differential   Screening for thyroid disorder       Relevant Orders   TSH   Abnormal appearance of  skin       Relevant Orders   Ambulatory referral to Dermatology      1) Mammogram - recommend yearly screening mammogram.  Mammogram Was ordered today   2) STI screening  was not offered and therefore not obtained  3) ASCCP guidelines and rational discussed.  Patient opts for every 5 years screening interval  4) Contraception - the patient is currently using  none.  She is happy with her current form of contraception and plans to continue  5) Colonoscopy -last performed in 2019,   6) Routine healthcare maintenance including cholesterol, diabetes screening discussed Ordered today  7) Osteoporosis screening - not at elevated risk for osteoporosis at this time   FRAX SCORE 1. Age: 70 2. Sex: female 3. Weight: 69 kg 4. Height: 162 cm 5. Previous Fracture: No 6. Parent Hip Fracture: No 7. Current Smoking: No 8. Glucocorticoids: No 9. Rheumatoid arthritis: No 10. Secondary osteoporosis: No 11. Alcohol ( 3 or more units a day):  Yes 12. Femoral Neck BMD (g/cm2): unknown   RESULT 10 year risk of  Major Osteoporotic Fracture: 6.6% 10 year risk of Hip Fracture: 0.5%  8) Abnormal skin appearance with long term skin exposure- dermatology referral placed. Encouraged yearly skin exams for history of chronic sum exposure  9) Covid vaccination encouraged  10)Follow up for annual in 1 year   Gregory, [place] 06/07/2020, 9:08 AM

## 2020-06-07 NOTE — Progress Notes (Signed)
Pt states she's here for a Annual Exam. 

## 2020-06-08 LAB — CBC WITH DIFFERENTIAL
Basophils Absolute: 0 10*3/uL (ref 0.0–0.2)
Basos: 1 %
EOS (ABSOLUTE): 0.2 10*3/uL (ref 0.0–0.4)
Eos: 4 %
Hematocrit: 43.2 % (ref 34.0–46.6)
Hemoglobin: 14.7 g/dL (ref 11.1–15.9)
Immature Grans (Abs): 0 10*3/uL (ref 0.0–0.1)
Immature Granulocytes: 0 %
Lymphocytes Absolute: 1.2 10*3/uL (ref 0.7–3.1)
Lymphs: 27 %
MCH: 32.4 pg (ref 26.6–33.0)
MCHC: 34 g/dL (ref 31.5–35.7)
MCV: 95 fL (ref 79–97)
Monocytes Absolute: 0.3 10*3/uL (ref 0.1–0.9)
Monocytes: 7 %
Neutrophils Absolute: 2.8 10*3/uL (ref 1.4–7.0)
Neutrophils: 61 %
RBC: 4.54 x10E6/uL (ref 3.77–5.28)
RDW: 12.7 % (ref 11.7–15.4)
WBC: 4.5 10*3/uL (ref 3.4–10.8)

## 2020-06-08 LAB — COMPREHENSIVE METABOLIC PANEL
ALT: 15 IU/L (ref 0–32)
AST: 19 IU/L (ref 0–40)
Albumin/Globulin Ratio: 2.1 (ref 1.2–2.2)
Albumin: 4.9 g/dL (ref 3.8–4.9)
Alkaline Phosphatase: 62 IU/L (ref 44–121)
BUN/Creatinine Ratio: 10 (ref 9–23)
BUN: 8 mg/dL (ref 6–24)
Bilirubin Total: 0.9 mg/dL (ref 0.0–1.2)
CO2: 25 mmol/L (ref 20–29)
Calcium: 9.4 mg/dL (ref 8.7–10.2)
Chloride: 104 mmol/L (ref 96–106)
Creatinine, Ser: 0.84 mg/dL (ref 0.57–1.00)
GFR calc Af Amer: 90 mL/min/{1.73_m2} (ref >59)
GFR calc non Af Amer: 78 mL/min/{1.73_m2} (ref >59)
Globulin, Total: 2.3 g/dL (ref 1.5–4.5)
Glucose: 83 mg/dL (ref 65–99)
Potassium: 4.1 mmol/L (ref 3.5–5.2)
Sodium: 142 mmol/L (ref 134–144)
Total Protein: 7.2 g/dL (ref 6.0–8.5)

## 2020-06-08 LAB — LIPID PANEL
Chol/HDL Ratio: 2.4 ratio (ref 0.0–4.4)
Cholesterol, Total: 201 mg/dL — ABNORMAL HIGH (ref 100–199)
HDL: 84 mg/dL (ref >39)
LDL Chol Calc (NIH): 100 mg/dL — ABNORMAL HIGH (ref 0–99)
Triglycerides: 95 mg/dL (ref 0–149)
VLDL Cholesterol Cal: 17 mg/dL (ref 5–40)

## 2020-06-08 LAB — TSH: TSH: 1.65 u[IU]/mL (ref 0.450–4.500)

## 2020-07-27 ENCOUNTER — Ambulatory Visit
Admission: RE | Admit: 2020-07-27 | Discharge: 2020-07-27 | Disposition: A | Discharge status: Home / Self Care | Payer: BC Managed Care – PPO | Source: Ambulatory Visit | Attending: Obstetrics and Gynecology | Admitting: Obstetrics and Gynecology

## 2020-07-27 ENCOUNTER — Other Ambulatory Visit: Payer: Self-pay

## 2020-07-27 DIAGNOSIS — Z1231 Encounter for screening mammogram for malignant neoplasm of breast: Secondary | ICD-10-CM

## 2020-10-18 ENCOUNTER — Ambulatory Visit: Payer: BC Managed Care – PPO | Admitting: Dermatology

## 2020-10-18 ENCOUNTER — Other Ambulatory Visit: Payer: Self-pay

## 2020-10-18 DIAGNOSIS — Z1283 Encounter for screening for malignant neoplasm of skin: Secondary | ICD-10-CM

## 2020-10-18 DIAGNOSIS — L821 Other seborrheic keratosis: Secondary | ICD-10-CM | POA: Diagnosis not present

## 2020-10-18 DIAGNOSIS — L814 Other melanin hyperpigmentation: Secondary | ICD-10-CM | POA: Diagnosis not present

## 2020-10-18 DIAGNOSIS — L570 Actinic keratosis: Secondary | ICD-10-CM

## 2020-10-18 DIAGNOSIS — D229 Melanocytic nevi, unspecified: Secondary | ICD-10-CM

## 2020-10-18 DIAGNOSIS — D1800 Hemangioma unspecified site: Secondary | ICD-10-CM

## 2020-10-18 DIAGNOSIS — L578 Other skin changes due to chronic exposure to nonionizing radiation: Secondary | ICD-10-CM | POA: Diagnosis not present

## 2020-10-18 NOTE — Patient Instructions (Addendum)

## 2020-10-18 NOTE — Progress Notes (Signed)
   New Patient Visit  Subjective  Michele Rodriguez is a 55 y.o. female who presents for the following: TBSE (Patient here for full body skin exam and skin cancer screening. No personal or fhx of skin cancer. Patient does have a hx of tanning. ). Patient advises she has had 2 moles removed in the past and were benign.  Patient had blepharoplasty last Thursday. The patient presents for Total-Body Skin Exam (TBSE) for skin cancer screening and mole check.  The following portions of the chart were reviewed this encounter and updated as appropriate:   Tobacco  Allergies  Meds  Problems  Med Hx  Surg Hx  Fam Hx     Review of Systems:  No other skin or systemic complaints except as noted in HPI or Assessment and Plan.  Objective  Well appearing patient in no apparent distress; mood and affect are within normal limits.  A full examination was performed including scalp, head, eyes, ears, nose, lips, neck, chest, axillae, abdomen, back, buttocks, bilateral upper extremities, bilateral lower extremities, hands, feet, fingers, toes, fingernails, and toenails. All findings within normal limits unless otherwise noted below.  Objective  Chest (4): Erythematous thin papules/macules with gritty scale.    Assessment & Plan  AK (actinic keratosis) (4) Chest Prior to procedure, discussed risks of skin dyspigmentation following cryotherapy.   Destruction of lesion - Chest Complexity: simple   Destruction method: cryotherapy   Informed consent: discussed and consent obtained   Timeout:  patient name, date of birth, surgical site, and procedure verified Lesion destroyed using liquid nitrogen: Yes   Region frozen until ice ball extended beyond lesion: Yes   Outcome: patient tolerated procedure well with no complications   Post-procedure details: wound care instructions given     Lentigines - Scattered tan macules - Discussed due to sun exposure - Benign, observe - Call for any  changes  Seborrheic Keratoses - Stuck-on, waxy, tan-brown papules and plaques  - Discussed benign etiology and prognosis. - Observe - Call for any changes  Melanocytic Nevi - Tan-brown and/or pink-flesh-colored symmetric macules and papules - Benign appearing on exam today - Observation - Call clinic for new or changing moles - Recommend daily use of broad spectrum spf 30+ sunscreen to sun-exposed areas.   Hemangiomas - Red papules - Discussed benign nature - Observe - Call for any changes  Actinic Damage - Chronic, secondary to cumulative UV/sun exposure - diffuse scaly erythematous macules with underlying dyspigmentation - Recommend daily broad spectrum sunscreen SPF 30+ to sun-exposed areas, reapply every 2 hours as needed.  - Call for new or changing lesions.  Skin cancer screening performed today.  Return in about 1 year (around 10/18/2021) for TBSE.  Graciella Belton, RMA, am acting as scribe for Sarina Ser, MD . Documentation: I have reviewed the above documentation for accuracy and completeness, and I agree with the above.  Sarina Ser, MD

## 2020-10-19 ENCOUNTER — Encounter: Payer: Self-pay | Admitting: Dermatology

## 2020-12-22 NOTE — Progress Notes (Signed)
Appointment cancelled

## 2021-01-17 DIAGNOSIS — Z9889 Other specified postprocedural states: Secondary | ICD-10-CM | POA: Insufficient documentation

## 2021-06-13 ENCOUNTER — Ambulatory Visit: Payer: BC Managed Care – PPO | Admitting: Obstetrics and Gynecology

## 2021-06-20 ENCOUNTER — Encounter: Payer: Self-pay | Admitting: Obstetrics and Gynecology

## 2021-06-20 ENCOUNTER — Ambulatory Visit (INDEPENDENT_AMBULATORY_CARE_PROVIDER_SITE_OTHER): Payer: BC Managed Care – PPO | Admitting: Obstetrics and Gynecology

## 2021-06-20 ENCOUNTER — Other Ambulatory Visit: Payer: Self-pay

## 2021-06-20 ENCOUNTER — Other Ambulatory Visit (HOSPITAL_COMMUNITY)
Admission: RE | Admit: 2021-06-20 | Discharge: 2021-06-20 | Disposition: A | Discharge status: Home / Self Care | Payer: BC Managed Care – PPO | Source: Ambulatory Visit | Attending: Obstetrics and Gynecology | Admitting: Obstetrics and Gynecology

## 2021-06-20 VITALS — BP 118/70 | Ht 64.0 in | Wt 151.0 lb

## 2021-06-20 DIAGNOSIS — Z0000 Encounter for general adult medical examination without abnormal findings: Secondary | ICD-10-CM | POA: Diagnosis not present

## 2021-06-20 DIAGNOSIS — Z124 Encounter for screening for malignant neoplasm of cervix: Secondary | ICD-10-CM

## 2021-06-20 DIAGNOSIS — Z01419 Encounter for gynecological examination (general) (routine) without abnormal findings: Secondary | ICD-10-CM | POA: Diagnosis not present

## 2021-06-20 DIAGNOSIS — Z1231 Encounter for screening mammogram for malignant neoplasm of breast: Secondary | ICD-10-CM

## 2021-06-20 DIAGNOSIS — B372 Candidiasis of skin and nail: Secondary | ICD-10-CM

## 2021-06-20 DIAGNOSIS — Z13220 Encounter for screening for lipoid disorders: Secondary | ICD-10-CM

## 2021-06-20 DIAGNOSIS — Z1329 Encounter for screening for other suspected endocrine disorder: Secondary | ICD-10-CM

## 2021-06-20 DIAGNOSIS — Z1239 Encounter for other screening for malignant neoplasm of breast: Secondary | ICD-10-CM

## 2021-06-20 DIAGNOSIS — Z130 Encounter for screening for diseases of the blood and blood-forming organs and certain disorders involving the immune mechanism: Secondary | ICD-10-CM

## 2021-06-20 DIAGNOSIS — Z131 Encounter for screening for diabetes mellitus: Secondary | ICD-10-CM

## 2021-06-20 MED ORDER — NYSTATIN 100000 UNIT/GM EX POWD: 1.0000 "application " | Freq: Three times a day (TID) | CUTANEOUS | 0 refills | Status: DC

## 2021-06-20 NOTE — Patient Instructions (Signed)
[employer] Recommended Dietary Allowances for Calcium and Vitamin D  Age (yr) Calcium Recommended Dietary Allowance (mg/day) Vitamin D Recommended Dietary Allowance (international units/day)  9-18 1,300 600  19-50 1,000 600  51-70 1,200 600  71 and older 1,200 800  Data from [employer]. Dietary reference intakes: calcium, vitamin D. [place], [place]: National Academies Press; 2011.   Exercising to Stay Healthy To become healthy and stay healthy, it is recommended that you do moderate-intensity and vigorous-intensity exercise. You can tell that you are exercising at a moderate intensity if your heart starts beating faster and you start breathing faster but can still hold a conversation. You can tell that you are exercising at a vigorous intensity if you are breathing much harder and faster and cannot hold a conversation while exercising. How can exercise benefit me? Exercising regularly is important. It has many health benefits, such as: Improving overall fitness, flexibility, and endurance. Increasing bone density. Helping with weight control. Decreasing body fat. Increasing muscle strength and endurance. Reducing stress and tension, anxiety, depression, or anger. Improving overall health. What guidelines should I follow while exercising? Before you start a new exercise program, talk with your health care provider. Do not exercise so much that you hurt yourself, feel dizzy, or get very short of breath. Wear comfortable clothes and wear shoes with good support. Drink plenty of water while you exercise to prevent dehydration or heat stroke. Work out until your breathing and your heartbeat get faster (moderate intensity). How often should I exercise? Choose an activity that you enjoy, and set realistic goals. Your health care provider can help you make an activity plan that is individually designed and works best for you. Exercise regularly as told by your health  care provider. This may include: Doing strength training two times a week, such as: Lifting weights. Using resistance bands. Push-ups. Sit-ups. Yoga. Doing a certain intensity of exercise for a given amount of time. Choose from these options: A total of 150 minutes of moderate-intensity exercise every week. A total of 75 minutes of vigorous-intensity exercise every week. A mix of moderate-intensity and vigorous-intensity exercise every week. Children, pregnant women, people who have not exercised regularly, people who are overweight, and older adults may need to talk with a health care provider about what activities are safe to perform. If you have a medical condition, be sure to talk with your health care provider before you start a new exercise program. What are some exercise ideas? Moderate-intensity exercise ideas include: Walking 1 mile (1.6 km) in about 15 minutes. Biking. Hiking. Golfing. Dancing. Water aerobics. Vigorous-intensity exercise ideas include: Walking 4.5 miles (7.2 km) or more in about 1 hour. Jogging or running 5 miles (8 km) in about 1 hour. Biking 10 miles (16.1 km) or more in about 1 hour. Lap swimming. Roller-skating or in-line skating. Cross-country skiing. Vigorous competitive sports, such as football, basketball, and soccer. Jumping rope. Aerobic dancing. What are some everyday activities that can help me get exercise? Yard work, such as: Pushing a lawn mower. Raking and bagging leaves. Washing your car. Pushing a stroller. Shoveling snow. Gardening. Washing windows or floors. How can I be more active in my day-to-day activities? Use stairs instead of an elevator. Take a walk during your lunch break. If you drive, park your car farther away from your work or school. If you take public transportation, get off one stop early and walk the rest of the way. Stand up or walk around during all of   your indoor phone calls. Get up, stretch, and walk  around every 30 minutes throughout the day. Enjoy exercise with a friend. Support to continue exercising will help you keep a regular routine of activity. Where to find more information You can find more information about exercising to stay healthy from: U.S. Department of Health and Human Services: www.hhs.gov Centers for Disease Control and Prevention (CDC): www.cdc.gov Summary Exercising regularly is important. It will improve your overall fitness, flexibility, and endurance. Regular exercise will also improve your overall health. It can help you control your weight, reduce stress, and improve your bone density. Do not exercise so much that you hurt yourself, feel dizzy, or get very short of breath. Before you start a new exercise program, talk with your health care provider. This information is not intended to replace advice given to you by your health care provider. Make sure you discuss any questions you have with your health care provider. Document Revised: 12/10/2020 Document Reviewed: 12/10/2020 Elsevier Patient Education  2022 [employer]. Budget-Friendly Healthy Eating There are many ways to save money at the grocery store and continue to eat healthy. You can be successful if you: Plan meals according to your budget. Make a grocery list and only purchase food according to your grocery list. Prepare food yourself at home. What are tips for following this plan? Reading food labels Compare food labels between brand name foods and the store brand. Often the nutritional value is the same, but the store brand is lower cost. Look for products that do not have added sugar, fat, or salt (sodium). These often cost the same but are healthier for you. Products may be labeled as: Sugar-free. Nonfat. Low-fat. Sodium-free. Low-sodium. Look for lean ground beef labeled as at least 92% lean and 8% fat. Shopping  Buy only the items on your grocery list and go only to the areas of the store  that have the items on your list. Use coupons only for foods and brands you normally buy. Avoid buying items you wouldn't normally buy simply because they are on sale. Check online and in newspapers for weekly deals. Buy healthy items from the bulk bins when available, such as herbs, spices, flour, pasta, nuts, and dried fruit. Buy fruits and vegetables that are in season. Prices are usually lower on in-season produce. Look at the unit price on the price tag. Use it to compare different brands and sizes to find out which item is the best deal. Choose healthy items that are often low-cost, such as carrots, potatoes, apples, bananas, and oranges. Dried or canned beans are a low-cost protein source. Buy in bulk and freeze extra food. Items you can buy in bulk include meats, fish, poultry, frozen fruits, and frozen vegetables. Avoid buying "ready-to-eat" foods, such as pre-cut fruits and vegetables and pre-made salads. If possible, shop around to discover where you can find the best prices. Consider other retailers such as dollar stores, larger wholesale stores, local fruit and vegetable stands, and farmers markets. Do not shop when you are hungry. If you shop while hungry, it may be hard to stick to your list and budget. Resist impulse buying. Use your grocery list as your official plan for the week. Buy a variety of vegetables and fruits by purchasing fresh, frozen, and canned items. Look at the top and bottom shelves for deals. Foods at eye level (eye level of an adult or child) are usually more expensive. Be efficient with your time when shopping. The more time you   spend at the store, the more money you are likely to spend. To save money when choosing more expensive foods like meats and dairy: Choose cheaper cuts of meat, such as bone-in chicken thighs and drumsticks instead of skinless and boneless chicken. When you are ready to prepare the chicken, you can remove the skin yourself to make it  healthier. Choose lean meats like chicken or turkey instead of beef. Choose canned seafood, such as tuna, salmon, or sardines. Buy eggs as a low-cost source of protein. Buy dried beans and peas, such as lentils, split peas, or kidney beans instead of meats. Dried beans and peas are a good alternative source of protein. Buy the larger tubs of yogurt instead of individual-sized containers. Choose water instead of sodas and other sweetened beverages. Avoid buying chips, cookies, and other "junk food." These items are usually expensive and not healthy. Cooking Make extra food and freeze the extras in meal-sized containers or in individual portions for fast meals and snacks. Pre-cook on days when you have extra time to prepare meals in advance. You can keep these meals in the fridge or freezer and reheat for a quick meal. When you come home from the grocery store, wash, peel, and cut fruits and vegetables so they are ready to use and eat. This will help reduce food waste. Meal planning Do not eat out or get fast food. Prepare food at home. Make a grocery list and make sure to bring it with you to the store. If you have a smart phone, you could use your phone to create your shopping list. Plan meals and snacks according to a grocery list and budget you create. Use leftovers in your meal plan for the week. Look for recipes where you can cook once and make enough food for two meals. Prepare budget-friendly types of meals like stews, casseroles, and stir-fry dishes. Try some meatless meals or try "no cook" meals like salads. Make sure that half your plate is filled with fruits or vegetables. Choose from fresh, frozen, or canned fruits and vegetables. If eating canned, remember to rinse them before eating. This will remove any excess salt added for packaging. Summary Eating healthy on a budget is possible if you plan your meals according to your budget, purchase according to your budget and grocery list,  and prepare food yourself. Tips for buying more food on a limited budget include buying generic brands, using coupons only for foods you normally buy, and buying healthy items from the bulk bins when available. Tips for buying cheaper food to replace expensive food include choosing cheaper, lean cuts of meat, and buying dried beans and peas. This information is not intended to replace advice given to you by your health care provider. Make sure you discuss any questions you have with your health care provider. Document Revised: 05/27/2020 Document Reviewed: 05/27/2020 Elsevier Patient Education  2022 Else[employer] Inc. Bone Health Bones protect organs, store calcium, anchor muscles, and support the whole body. Keeping your bones strong is important, especially as you get older. You can take actions to help keep your bones strong and healthy. Why is keeping my bones healthy important? Keeping your bones healthy is important because your body constantly replaces bone cells. Cells get old, and new cells take their place. As we age, we lose bone cells because the body may not be able to make enough new cells to replace the old cells. The amount of bone cells and bone tissue you have is referred to as   bone mass. The higher your bone mass, the stronger your bones. The aging process leads to an overall loss of bone mass in the body, which can increase the likelihood of: Joint pain and stiffness. Broken bones. A condition in which the bones become weak and brittle (osteoporosis). A large decline in bone mass occurs in older adults. In women, it occurs about the time of menopause. What actions can I take to keep my bones healthy? Good health habits are important for maintaining healthy bones. This includes eating nutritious foods and exercising regularly. To have healthy bones, you need to get enough of the right minerals and vitamins. Most nutrition experts recommend getting these nutrients from the foods that  you eat. In some cases, taking supplements may also be recommended. Doing certain types of exercise is also important for bone health. What are the nutritional recommendations for healthy bones? Eating a well-balanced diet with plenty of calcium and vitamin D will help to protect your bones. Nutritional recommendations vary from person to person. Ask your health care provider what is healthy for you. Here are some general guidelines. Get enough calcium Calcium is the most important (essential) mineral for bone health. Most people can get enough calcium from their diet, but supplements may be recommended for people who are at risk for osteoporosis. Good sources of calcium include: Dairy products, such as low-fat or nonfat milk, cheese, and yogurt. Dark green leafy vegetables, such as bok choy and broccoli. Calcium-fortified foods, such as orange juice, cereal, bread, soy beverages, and tofu products. Nuts, such as almonds. Follow these recommended amounts for daily calcium intake: Children, age 60-3: 700 mg. Children, age 77-8: 1,000 mg. Children, age 61-13: 1,300 mg. Teens, age 2-18: 1,300 mg. Adults, age 59-50: 1,000 mg. Adults, age 72-70: Men: 1,000 mg. Women: 1,200 mg. Adults, age 771 or older: 1,200 mg. Pregnant and breastfeeding females: Teens: 1,300 mg. Adults: 1,000 mg. Get enough vitamin D Vitamin D is the most essential vitamin for bone health. It helps the body absorb calcium. Sunlight stimulates the skin to make vitamin D, so be sure to get enough sunlight. If you live in a cold climate or you do not get outside often, your health care provider may recommend that you take vitamin D supplements. Good sources of vitamin D in your diet include: Egg yolks. Saltwater fish. Milk and cereal fortified with vitamin D. Follow these recommended amounts for daily vitamin D intake: Children and teens, age 60-18: 600 international units. Adults, age 80 or younger: 400-800 international  units. Adults, age 771 or older: 800-1,000 international units. Get other important nutrients Other nutrients that are important for bone health include: Phosphorus. This mineral is found in meat, poultry, dairy foods, nuts, and legumes. The recommended daily intake for adult men and adult women is 700 mg. Magnesium. This mineral is found in seeds, nuts, dark green vegetables, and legumes. The recommended daily intake for adult men is 400-420 mg. For adult women, it is 310-320 mg. Vitamin K. This vitamin is found in green leafy vegetables. The recommended daily intake is 120 mg for adult men and 90 mg for adult women. What type of physical activity is best for building and maintaining healthy bones? Weight-bearing and strength-building activities are important for building and maintaining healthy bones. Weight-bearing activities cause muscles and bones to work against gravity. Strength-building activities increase the strength of the muscles that support bones. Weight-bearing and muscle-building activities include: Walking and hiking. Jogging and running. Dancing. Gym exercises. Lifting weights. Tennis  and racquetball. Climbing stairs. Aerobics. Adults should get at least 30 minutes of moderate physical activity on most days. Children should get at least 60 minutes of moderate physical activity on most days. Ask your health care provider what type of exercise is best for you. How can I find out if my bone mass is low? Bone mass can be measured with an X-ray test called a bone mineral density (BMD) test. This test is recommended for all women who are age 4 or older. It may also be recommended for: Men who are age 24 or older. People who are at risk for osteoporosis because of: Having bones that break easily. Having a long-term disease that weakens bones, such as kidney disease or rheumatoid arthritis. Having menopause earlier than normal. Taking medicine that weakens bones, such as steroids,  thyroid hormones, or hormone treatment for breast cancer or prostate cancer. Smoking. Drinking three or more alcoholic drinks a day. If you find that you have a low bone mass, you may be able to prevent osteoporosis or further bone loss by changing your diet and lifestyle. Where can I find more information? For more information, check out the following websites: [place]: [employer] of Health: www.bones.SouthExposed.es International Osteoporosis Foundation: Administrator.iofbonehealth.org Summary The aging process leads to an overall loss of bone mass in the body, which can increase the likelihood of broken bones and osteoporosis. Eating a well-balanced diet with plenty of calcium and vitamin D will help to protect your bones. Weight-bearing and strength-building activities are also important for building and maintaining strong bones. Bone mass can be measured with an X-ray test called a bone mineral density (BMD) test. This information is not intended to replace advice given to you by your health care provider. Make sure you discuss any questions you have with your health care provider. Document Revised: 09/10/2017 Document Reviewed: 09/10/2017 Elsevier Patient Education  2022 [place]. Skin Yeast Infection A skin yeast infection is a condition in which there is an overgrowth of yeast (Candida) that normally lives on the skin. This condition usually occurs in areas of the skin that are constantly warm and moist, such as the skin under the breasts or armpits, or in the groin and other body folds. What are the causes? This condition is caused by a change in the normal balance of the yeast that live on the skin. What increases the risk? You are more likely to develop this condition if you: Are obese. Are pregnant. Are 107 years of age or older. Wear tight clothing. Have any of the following conditions: Diabetes. Malnutrition. A weak body defense  system (immune system). Take medicines such as: Birth control pills. Antibiotics. Steroid medicines. What are the signs or symptoms? The most common symptom of this condition is itchiness in the affected area. Other symptoms include: A red, swollen area of the skin. Bumps on the skin. How is this diagnosed? This condition is diagnosed with a medical history and physical exam. Your health care provider may check for yeast by taking scrapings of the skin to be viewed under a microscope. How is this treated? This condition is treated with medicine. Medicines may be prescribed or available over the counter. The medicines may be: Taken by mouth (orally). Applied as a cream or powder to your skin. Follow these instructions at home:  Take or apply over-the-counter and prescription medicines only as told by your health care provider. Maintain a healthy weight. If you need help losing weight, talk with your  health care provider. Keep your skin clean and dry. Wear loose-fitting clothing. If you have diabetes, keep your blood sugar under control. Keep all follow-up visits. This is important. Contact a health care provider if: Your symptoms go away and then come back. Your symptoms do not get better with treatment. Your symptoms get worse. Your rash spreads. You have a fever or chills. You have new symptoms. You have new warmth or redness of your skin. Your rash is painful or bleeding. Summary A skin yeast infection is a condition in which there is an overgrowth of yeast (Candida) that normally lives on the skin. Take or apply over-the-counter and prescription medicines only as told by your health care provider. Keep your skin clean and dry. Contact a health care provider if your symptoms do not get better with treatment. This information is not intended to replace advice given to you by your health care provider. Make sure you discuss any questions you have with your health care  provider. Document Revised: 11/02/2020 Document Reviewed: 11/02/2020 Elsevier Patient Education  [place].

## 2021-06-20 NOTE — Progress Notes (Signed)
Gynecology Annual Exam  PCP: Patient, No Pcp Per (Inactive)  Chief Complaint:  Chief Complaint  Patient presents with   Gynecologic Exam    Annual Exam    History of Present Illness: Patient is a 55 y.o. [id number] presents for annual exam. The patient has no complaints today.   LMP: No LMP recorded. Patient is postmenopausal. She denies postmenopausal bleeding or spotting  The patient is sexually active. She denies dyspareunia.  Postcoital Bleeding: no   The patient does perform self breast exams.  There is no notable family history of breast or ovarian cancer in her family.  The patient has regular exercise: yes running walking exercise bike and tread climber 2 to 4 days a week.  The patient denies current symptoms of depression.   PHQ-9: 4 GAD-7: 5   Review of Systems: Review of Systems  Constitutional:  Negative for chills, fever, malaise/fatigue and weight loss.  HENT:  Negative for congestion, hearing loss and sinus pain.   Eyes:  Negative for blurred vision and double vision.  Respiratory:  Negative for cough, sputum production, shortness of breath and wheezing.   Cardiovascular:  Negative for chest pain, palpitations, orthopnea and leg swelling.  Gastrointestinal:  Negative for abdominal pain, constipation, diarrhea, nausea and vomiting.  Genitourinary:  Negative for dysuria, flank pain, frequency, hematuria and urgency.  Musculoskeletal:  Negative for back pain, falls and joint pain.  Skin:  Negative for itching and rash.  Neurological:  Negative for dizziness and headaches.  Endo/Heme/Allergies:  Bruises/bleeds easily.  Psychiatric/Behavioral:  Negative for depression, substance abuse and suicidal ideas. The patient is not nervous/anxious.    Past Medical History:  Past Medical History:  Diagnosis Date   BRCA negative 05/2019   MyRisk neg; IBIS=8.2%/riskscore=8.0%   Family history of colon cancer    Family history of pancreatic cancer     Past Surgical  History:  Past Surgical History:  Procedure Laterality Date   COLONOSCOPY WITH PROPOFOL N/A 05/14/2015   Procedure: COLONOSCOPY WITH PROPOFOL;  Surgeon: Josefine Class, MD;  Location: [facility] ENDOSCOPY;  Service: Endoscopy;  Laterality: N/A;   FOOT SURGERY Bilateral 1983   to align big toe, pt states it might been "bunion surgery"    KNEE ARTHROSCOPY Bilateral 1983   "to align knee cap" pt states   [place]    Gynecologic History:  No LMP recorded. Patient is postmenopausal. Last Pap: Results were: 2019 NIL  [place] mammogram 2021   BI-RAD I  Obstetric History: G2P2002  Family History:  Family History  Problem Relation Age of Onset   Diabetes Father    Hypotension Father    Prostate cancer Father 1   Lung cancer Sister    Colon cancer Paternal Grandfather    Pancreatic cancer Maternal Aunt    Colon cancer Maternal Grandfather    Breast cancer Neg Hx     Social History:  Social History   Socioeconomic History   Marital status: Single    Spouse name: Not on file   Number of children: Not on file   Years of education: Not on file   Highest education level: Not on file  Occupational History   Not on file  Tobacco Use   Smoking status: Never   Smokeless tobacco: Never  Vaping Use   Vaping Use: Never used  Substance and Sexual Activity   Alcohol use: Yes    Alcohol/week: 11.0 standard drinks    Types: 11 Cans of beer per week  Drug use: No   Sexual activity: Yes    Birth control/protection: Surgical  Other Topics Concern   Not on file  Social History Narrative   Not on file   Social Determinants of Health   Financial Resource Strain: Not on file  Food Insecurity: Not on file  Transportation Needs: Not on file  Physical Activity: Not on file  Stress: Not on file  Social Connections: Not on file  Intimate Partner Violence: Not on file    Allergies:  No Known Allergies  Medications: Prior to Admission medications   Medication Sig Start  Date End Date Taking? Authorizing Provider  Multiple Vitamin (MULTIVITAMIN) capsule Take by mouth.   Yes [provider]  erythromycin ophthalmic ointment Apply two times a day to suture sites Patient not taking: Reported on 06/20/2021 10/14/20   [provider]    Physical Exam Vitals: Blood pressure 118/70, height '5\' 4"'  (1.626 m), weight 151 lb (68.5 kg).  Physical Exam Constitutional:      Appearance: She is well-developed.  Genitourinary:     Genitourinary Comments: External: Normal appearing vulva. No lesions noted.  Bimanual examination: Uterus midline, non-tender, normal in size, shape and contour.  No CMT. No adnexal masses. No adnexal tenderness. Pelvis not fixed.  Breast Exam: breast equal without, nipple discharge, breast lump or enlarged lymph nodes. Erythematous skin rash under bilateral breast consistent with yeast.   HENT:     Head: Normocephalic and atraumatic.  Neck:     Thyroid: No thyromegaly.  Cardiovascular:     Rate and Rhythm: Normal rate and regular rhythm.     Heart sounds: Normal heart sounds.  Pulmonary:     Effort: Pulmonary effort is normal.     Breath sounds: Normal breath sounds.  Abdominal:     General: Bowel sounds are normal. There is no distension.     Palpations: Abdomen is soft. There is no mass.  Musculoskeletal:     Cervical back: Neck supple.  Neurological:     Mental Status: She is alert and oriented to person, place, and time.  Skin:    General: Skin is warm and dry.  Psychiatric:        Behavior: Behavior normal.        Thought Content: Thought content normal.        Judgment: Judgment normal.  Vitals reviewed.     Female chaperone present for pelvic and breast  portions of the physical exam  Assessment: 55 y.o. G2P2002 routine annual exam  Plan: Problem List Items Addressed This Visit   None Visit Diagnoses     Encounter for annual routine gynecological examination    -  Primary   Health maintenance  examination       Breast cancer screening by mammogram       Relevant Orders   MM 3D SCREEN BREAST BILATERAL   Cervical cancer screening       Relevant Orders   Cytology - PAP   Screening for deficiency anemia       Relevant Orders   CBC With Differential   Screening for diabetes mellitus       Relevant Orders   Comprehensive metabolic panel   Hemoglobin A1c   Screening cholesterol level       Relevant Orders   Lipid panel   Encounter for screening breast examination       Screening for thyroid disorder       Relevant Orders   TSH   Skin  yeast infection       Relevant Medications   nystatin (MYCOSTATIN/NYSTOP) powder       1) Mammogram - recommend yearly screening mammogram.  Mammogram  was ordered today.  2) STI screening was offered and declined  3) Pap smear is up-to-date  4) Colonoscopy --last performed in 2019.  5-year follow-up recommended.   5) Routine healthcare maintenance including cholesterol, diabetes screening discussed.  Patient has plans to establish with primary care physician.  She would like to do fasting labs prior to that visit.  Orders placed.  6) Osteoporosis screening -patient reports increased risk factor of sometimes consuming 3 or more units of alcohol a day.  7) Discussed mild symptoms of anxiety/depression- no treatment desired at this time  8) Some difficulty with staying asleep at night. Discussed melatonin or unisom for sleep  9) Yeast infection under breast- topical nystatin  Adrian Prows MD, [place], [facility] 06/20/2021 12:50 PM

## 2021-06-22 LAB — CYTOLOGY - PAP
Comment: NEGATIVE
Diagnosis: NEGATIVE
High risk HPV: NEGATIVE

## 2021-06-23 ENCOUNTER — Other Ambulatory Visit: Payer: Self-pay

## 2021-06-23 ENCOUNTER — Other Ambulatory Visit: Payer: BC Managed Care – PPO

## 2021-06-23 DIAGNOSIS — Z131 Encounter for screening for diabetes mellitus: Secondary | ICD-10-CM

## 2021-06-23 DIAGNOSIS — Z1329 Encounter for screening for other suspected endocrine disorder: Secondary | ICD-10-CM

## 2021-06-23 DIAGNOSIS — Z130 Encounter for screening for diseases of the blood and blood-forming organs and certain disorders involving the immune mechanism: Secondary | ICD-10-CM

## 2021-06-24 LAB — CBC WITH DIFFERENTIAL
Basophils Absolute: 0 10*3/uL (ref 0.0–0.2)
Basos: 1 %
EOS (ABSOLUTE): 0.2 10*3/uL (ref 0.0–0.4)
Eos: 5 %
Hematocrit: 43.7 % (ref 34.0–46.6)
Hemoglobin: 14.5 g/dL (ref 11.1–15.9)
Immature Grans (Abs): 0 10*3/uL (ref 0.0–0.1)
Immature Granulocytes: 0 %
Lymphocytes Absolute: 1.2 10*3/uL (ref 0.7–3.1)
Lymphs: 37 %
MCH: 31.4 pg (ref 26.6–33.0)
MCHC: 33.2 g/dL (ref 31.5–35.7)
MCV: 95 fL (ref 79–97)
Monocytes Absolute: 0.3 10*3/uL (ref 0.1–0.9)
Monocytes: 8 %
Neutrophils Absolute: 1.6 10*3/uL (ref 1.4–7.0)
Neutrophils: 49 %
RBC: 4.62 x10E6/uL (ref 3.77–5.28)
RDW: 12.2 % (ref 11.7–15.4)
WBC: 3.2 10*3/uL — ABNORMAL LOW (ref 3.4–10.8)

## 2021-06-24 LAB — COMPREHENSIVE METABOLIC PANEL
ALT: 13 IU/L (ref 0–32)
AST: 20 IU/L (ref 0–40)
Albumin/Globulin Ratio: 1.9 (ref 1.2–2.2)
Albumin: 4.4 g/dL (ref 3.8–4.9)
Alkaline Phosphatase: 52 IU/L (ref 44–121)
BUN/Creatinine Ratio: 13 (ref 9–23)
BUN: 11 mg/dL (ref 6–24)
Bilirubin Total: 1.2 mg/dL (ref 0.0–1.2)
CO2: 24 mmol/L (ref 20–29)
Calcium: 9.4 mg/dL (ref 8.7–10.2)
Chloride: 105 mmol/L (ref 96–106)
Creatinine, Ser: 0.85 mg/dL (ref 0.57–1.00)
Globulin, Total: 2.3 g/dL (ref 1.5–4.5)
Glucose: 80 mg/dL (ref 70–99)
Potassium: 4.5 mmol/L (ref 3.5–5.2)
Sodium: 142 mmol/L (ref 134–144)
Total Protein: 6.7 g/dL (ref 6.0–8.5)
eGFR: 80 mL/min/{1.73_m2} (ref >59)

## 2021-06-24 LAB — HEMOGLOBIN A1C
Est. average glucose Bld gHb Est-mCnc: 100 mg/dL
Hgb A1c MFr Bld: 5.1 % (ref 4.8–5.6)

## 2021-06-24 LAB — TSH: TSH: 1.93 u[IU]/mL (ref 0.450–4.500)

## 2021-07-28 ENCOUNTER — Other Ambulatory Visit: Payer: Self-pay

## 2021-07-28 ENCOUNTER — Ambulatory Visit
Admission: RE | Admit: 2021-07-28 | Discharge: 2021-07-28 | Disposition: A | Discharge status: Home / Self Care | Payer: BC Managed Care – PPO | Source: Ambulatory Visit | Attending: Obstetrics and Gynecology | Admitting: Obstetrics and Gynecology

## 2021-07-28 DIAGNOSIS — Z1231 Encounter for screening mammogram for malignant neoplasm of breast: Secondary | ICD-10-CM | POA: Diagnosis not present

## 2021-10-19 ENCOUNTER — Ambulatory Visit: Payer: BC Managed Care – PPO | Admitting: Dermatology

## 2021-11-21 ENCOUNTER — Other Ambulatory Visit: Payer: Self-pay

## 2021-11-21 ENCOUNTER — Encounter: Payer: Self-pay | Admitting: Dermatology

## 2021-11-21 ENCOUNTER — Ambulatory Visit: Payer: BC Managed Care – PPO | Admitting: Dermatology

## 2021-11-21 DIAGNOSIS — Z1283 Encounter for screening for malignant neoplasm of skin: Secondary | ICD-10-CM

## 2021-11-21 DIAGNOSIS — L700 Acne vulgaris: Secondary | ICD-10-CM

## 2021-11-21 DIAGNOSIS — D1800 Hemangioma unspecified site: Secondary | ICD-10-CM

## 2021-11-21 DIAGNOSIS — L821 Other seborrheic keratosis: Secondary | ICD-10-CM

## 2021-11-21 DIAGNOSIS — L820 Inflamed seborrheic keratosis: Secondary | ICD-10-CM

## 2021-11-21 DIAGNOSIS — L578 Other skin changes due to chronic exposure to nonionizing radiation: Secondary | ICD-10-CM | POA: Diagnosis not present

## 2021-11-21 DIAGNOSIS — L918 Other hypertrophic disorders of the skin: Secondary | ICD-10-CM

## 2021-11-21 DIAGNOSIS — L814 Other melanin hyperpigmentation: Secondary | ICD-10-CM

## 2021-11-21 DIAGNOSIS — Z872 Personal history of diseases of the skin and subcutaneous tissue: Secondary | ICD-10-CM

## 2021-11-21 DIAGNOSIS — D229 Melanocytic nevi, unspecified: Secondary | ICD-10-CM

## 2021-11-21 NOTE — Progress Notes (Signed)
? ? ?Follow-Up Visit ?  ?Subjective  ?Michele Rodriguez is a 56 y.o. 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 ). The patient presents for Total-Body Skin Exam (TBSE) for skin cancer screening and mole check.  The patient has spots, moles and lesions to be evaluated, some may be new or changing. ? ?The following portions of the chart were reviewed this encounter and updated as appropriate:  ? Tobacco  Allergies  Meds  Problems  Med Hx  Surg Hx  Fam Hx   ?  ?Review of Systems:  No other skin or systemic complaints except as noted in HPI or Assessment and Plan. ? ?Objective  ?Well appearing patient in no apparent distress; mood and affect are within normal limits. ? ?A full examination was performed including scalp, head, eyes, ears, nose, lips, neck, chest, axillae, abdomen, back, buttocks, bilateral upper extremities, bilateral lower extremities, hands, feet, fingers, toes, fingernails, and toenails. All findings within normal limits unless otherwise noted below. ? ?R chest ?Open comedone.  ? ?Right Medial Thigh x 1 ?Erythematous stuck-on, waxy papule or plaque ? ? ?Assessment & Plan  ?Comedone ?R chest ?Benign-appearing.  Observation.  Call clinic for new or changing lesions.  Recommend daily use of broad spectrum spf 30+ sunscreen to sun-exposed areas.  ? ?Inflamed seborrheic keratosis ?Right Medial Thigh x 1 ?Destruction of lesion - Right Medial Thigh x 1 ?Complexity: simple   ?Destruction method: cryotherapy   ?Informed consent: discussed and consent obtained   ?Timeout:  patient name, date of birth, surgical site, and procedure verified ?Lesion destroyed using liquid nitrogen: Yes   ?Region frozen until ice ball extended beyond lesion: Yes   ?Outcome: patient tolerated procedure well with no complications   ?Post-procedure details: wound care instructions given   ? ?Skin cancer  screening ? ?Acrochordons (Skin Tags) ?- Fleshy, skin-colored pedunculated papules ?- Benign appearing.  ?- Observe. ?- If desired, they can be removed with an in office procedure that is not covered by insurance. ?- Please call the clinic if you notice any new or changing lesions. ? ?Lentigines ?- Scattered tan macules ?- Due to sun exposure ?- Benign-appearing, observe ?- Recommend daily broad spectrum sunscreen SPF 30+ to sun-exposed areas, reapply every 2 hours as needed. ?- Call for any changes ? ?Seborrheic Keratoses ?- Stuck-on, waxy, tan-brown papules and/or plaques  ?- Benign-appearing ?- Discussed benign etiology and prognosis. ?- Observe ?- Call for any changes ? ?Melanocytic Nevi ?- Tan-brown and/or pink-flesh-colored symmetric macules and papules ?- Benign appearing on exam today ?- Observation ?- Call clinic for new or changing moles ?- Recommend daily use of broad spectrum spf 30+ sunscreen to sun-exposed areas.  ? ?Hemangiomas ?- Red papules ?- Discussed benign nature ?- Observe ?- Call for any changes ? ?Actinic Damage ?- Chronic condition, secondary to cumulative UV/sun exposure ?- diffuse scaly erythematous macules with underlying dyspigmentation ?- Recommend daily broad spectrum sunscreen SPF 30+ to sun-exposed areas, reapply every 2 hours as needed.  ?- Staying in the shade or wearing long sleeves, sun glasses (UVA+UVB protection) and wide brim hats (4-inch brim around the entire circumference of the hat) are also recommended for sun protection.  ?- Call for new or changing lesions. ? ?Skin cancer screening performed today. ? ?Return in about 1 year (around 11/22/2022) for TBSE - hx AK's . ? ?IRudell Cobb,  CMA, am acting as scribe for Sarina Ser, MD . ?Documentation: I have reviewed the above documentation for accuracy and completeness, and I agree with the above. ? ?Sarina Ser, MD ? ?

## 2021-11-21 NOTE — Patient Instructions (Signed)

## 2021-11-22 ENCOUNTER — Encounter: Payer: Self-pay | Admitting: Dermatology

## 2022-06-14 ENCOUNTER — Other Ambulatory Visit: Payer: Self-pay | Admitting: Obstetrics and Gynecology

## 2022-07-04 ENCOUNTER — Ambulatory Visit (INDEPENDENT_AMBULATORY_CARE_PROVIDER_SITE_OTHER): Payer: BC Managed Care – PPO | Admitting: Obstetrics and Gynecology

## 2022-07-04 ENCOUNTER — Encounter: Payer: Self-pay | Admitting: Obstetrics and Gynecology

## 2022-07-04 VITALS — BP 135/72 | HR 52 | Resp 16 | Ht 64.0 in | Wt 151.0 lb

## 2022-07-04 DIAGNOSIS — Z7689 Persons encountering health services in other specified circumstances: Secondary | ICD-10-CM

## 2022-07-04 DIAGNOSIS — Z131 Encounter for screening for diabetes mellitus: Secondary | ICD-10-CM

## 2022-07-04 DIAGNOSIS — N952 Postmenopausal atrophic vaginitis: Secondary | ICD-10-CM

## 2022-07-04 DIAGNOSIS — Z1231 Encounter for screening mammogram for malignant neoplasm of breast: Secondary | ICD-10-CM

## 2022-07-04 DIAGNOSIS — Z113 Encounter for screening for infections with a predominantly sexual mode of transmission: Secondary | ICD-10-CM

## 2022-07-04 DIAGNOSIS — Z13220 Encounter for screening for lipoid disorders: Secondary | ICD-10-CM

## 2022-07-04 DIAGNOSIS — Z01419 Encounter for gynecological examination (general) (routine) without abnormal findings: Secondary | ICD-10-CM

## 2022-07-04 DIAGNOSIS — Z1329 Encounter for screening for other suspected endocrine disorder: Secondary | ICD-10-CM

## 2022-07-04 NOTE — Progress Notes (Signed)
ANNUAL PREVENTATIVE CARE GYNECOLOGY  ENCOUNTER NOTE  Subjective:       Michele Rodriguez is a 56 y.o. [phone] female here for a routine annual gynecologic exam and establish care with a new provider.  Patient was last seen by Dr Adrian Prows of [facility]. The patient is sexually active. The patient is not taking hormone replacement therapy. Patient denies post-menopausal vaginal bleeding. The patient wears seatbelts: yes. The patient participates in regular exercise: yes. Has the patient ever been transfused or tattooed?: yes. The patient reports that there is not domestic violence in her life.  Current complaints: 1.  None   Gynecologic History No LMP recorded. Patient is postmenopausal. Contraception: post menopausal status Last Pap: 06/20/2021. Results were: normal Last mammogram: 07/28/2021. Results were: normal Last Colonoscopy: 05/13/2018 (outside facility): Repeat colonoscopy for surveillance based on pathology results, likely in 5 years. Last Dexa Scan: Never Done   Obstetric History OB History  Gravida Para Term Preterm AB Living  _0 SAB IAB Ectopic Multiple Live Births          2    # Outcome Date GA Lbr Len/2nd Weight Sex Delivery Anes PTL Lv  2 Term 01/23/91    [place] LIV  1 Term 10/26/89    F Vag-Spont   LIV    Past Medical History:  Diagnosis Date   Actinic keratosis    BRCA negative 05/2019   MyRisk neg; IBIS=8.2%/riskscore=8.0%   Family history of colon cancer    Family history of pancreatic cancer     Family History  Problem Relation Age of Onset   Diabetes Father    Hypotension Father    Prostate cancer Father 74   Lung cancer Sister    Colon cancer Paternal Grandfather    Pancreatic cancer Maternal Aunt    Colon cancer Maternal Grandfather    Breast cancer Neg Hx     Past Surgical History:  Procedure Laterality Date   COLONOSCOPY WITH PROPOFOL N/A 05/14/2015   Procedure: COLONOSCOPY WITH PROPOFOL;  Surgeon: Josefine Class, MD;  Location: [facility] ENDOSCOPY;  Service: Endoscopy;  Laterality: N/A;   FOOT SURGERY Bilateral 1983   to align big toe, pt states it might been "bunion surgery"    KNEE ARTHROSCOPY Bilateral 1983   "to align knee cap" pt states   TUBAL LIGATION  1992    Social History   Socioeconomic History   Marital status: Single    Spouse name: Not on file   Number of children: Not on file   Years of education: Not on file   Highest education level: Not on file  Occupational History   Not on file  Tobacco Use   Smoking status: Never   Smokeless tobacco: Never  Vaping Use   Vaping Use: Never used  Substance and Sexual Activity   Alcohol use: Yes    Alcohol/week: 11.0 standard drinks of alcohol    Types: 11 Cans of beer per week   Drug use: No   Sexual activity: Yes    Birth control/protection: Surgical  Other Topics Concern   Not on file  Social History Narrative   Not on file   Social Determinants of Health   Financial Resource Strain: Not on file  Food Insecurity: Not on file  Transportation Needs: Not on file  Physical Activity: Not on file  Stress: Not on file  Social Connections: Not on file  Intimate Partner  Violence: Not on file    Current Outpatient Medications on File Prior to Visit  Medication Sig Dispense Refill   CALCIUM PO Take by mouth.     erythromycin ophthalmic ointment Apply two times a day to suture sites (Patient not taking: Reported on 06/20/2021)     Multiple Vitamin (MULTIVITAMIN) capsule Take by mouth. (Patient not taking: Reported on 11/21/2021)     Multiple Vitamins-Minerals (ONE-A-DAY WOMENS 50+ PO) Take by mouth.     nystatin (MYCOSTATIN/NYSTOP) powder Apply 1 application topically 3 (three) times daily. (Patient not taking: Reported on 11/21/2021) 15 g 0   VITAMIN D PO Take by mouth.     No current facility-administered medications on file prior to visit.    No Known Allergies    Review of Systems ROS Review of Systems -  General ROS: negative for - chills, fatigue, fever, hot flashes, night sweats, weight gain or weight loss Psychological ROS: negative for - anxiety, decreased libido, depression, mood swings, physical abuse or sexual abuse Ophthalmic ROS: negative for - blurry vision, eye pain or loss of vision ENT ROS: negative for - headaches, hearing change, visual changes or vocal changes Allergy and Immunology ROS: negative for - hives, itchy/watery eyes or seasonal allergies Hematological and Lymphatic ROS: negative for - bleeding problems, bruising, swollen lymph nodes or weight loss Endocrine ROS: negative for - galactorrhea, hair pattern changes, hot flashes, malaise/lethargy, mood swings, palpitations, polydipsia/polyuria, skin changes, temperature intolerance or unexpected weight changes Breast ROS: negative for - new or changing breast lumps or nipple discharge Respiratory ROS: negative for - cough or shortness of breath Cardiovascular ROS: negative for - chest pain, irregular heartbeat, palpitations or shortness of breath Gastrointestinal ROS: no abdominal pain, change in bowel habits, or black or bloody stools Genito-Urinary ROS: no dysuria, trouble voiding, or hematuria Musculoskeletal ROS: negative for - joint pain or joint stiffness Neurological ROS: negative for - bowel and bladder control changes Dermatological ROS: negative for rash and skin lesion changes   Objective:   BP 135/72   Pulse (!) 52   Resp 16   Ht _0  (1.626 m)   Wt 151 lb (68.5 kg)   BMI 25.92 kg/m  CONSTITUTIONAL: Well-developed, well-nourished female in no acute distress.  PSYCHIATRIC: Normal mood and affect. Normal behavior. Normal judgment and thought content. [place]: Alert and oriented to person, place, and time. Normal muscle tone coordination. No cranial nerve deficit noted. HENT:  Normocephalic, atraumatic, External right and left ear normal. Oropharynx is clear and moist EYES: Conjunctivae and EOM are  normal. Pupils are equal, round, and reactive to light. No scleral icterus.  NECK: Normal range of motion, supple, no masses.  Normal thyroid.  SKIN: Skin is warm and dry. No rash noted. Not diaphoretic. No erythema. No pallor. CARDIOVASCULAR: Normal heart rate noted, regular rhythm, no murmur. RESPIRATORY: Clear to auscultation bilaterally. Effort and breath sounds normal, no problems with respiration noted. BREASTS: Symmetric in size. No masses, skin changes, nipple drainage, or lymphadenopathy. ABDOMEN: Soft, normal bowel sounds, no distention noted.  No tenderness, rebound or guarding.  BLADDER: Normal PELVIC:  Bladder no bladder distension noted  Urethra: normal appearing urethra with no masses, tenderness or lesions  Vulva: normal appearing vulva with no masses, tenderness or lesions  Vagina: atrophic (mild), no discharge or lesions present  Cervix: normal appearing cervix without discharge or lesions  Uterus: uterus is normal size, shape, consistency and nontender  Adnexa: normal adnexa in size, nontender and no masses  RV: External  Exam NormaI, No Rectal Masses, and Normal Sphincter tone  MUSCULOSKELETAL: Normal range of motion. No tenderness.  No cyanosis, clubbing, or edema.  2+ distal pulses. LYMPHATIC: No Axillary, Supraclavicular, or Inguinal Adenopathy.   Labs: Lab Results  Component Value Date   WBC 3.2 (L) 06/23/2021   HGB 14.5 06/23/2021   HCT 43.7 06/23/2021   MCV 95 06/23/2021    Lab Results  Component Value Date   CREATININE 0.85 06/23/2021   BUN 11 06/23/2021   NA 142 06/23/2021   K 4.5 06/23/2021   CL 105 06/23/2021   CO2 24 06/23/2021    Lab Results  Component Value Date   ALT 13 06/23/2021   AST 20 06/23/2021   ALKPHOS 52 06/23/2021   BILITOT 1.2 06/23/2021    Lab Results  Component Value Date   CHOL 201 (H) 06/07/2020   HDL 84 06/07/2020   LDLCALC 100 (H) 06/07/2020   TRIG 95 06/07/2020   CHOLHDL 2.4 06/07/2020    Lab Results   Component Value Date   TSH 1.930 06/23/2021    Lab Results  Component Value Date   HGBA1C 5.1 06/23/2021     Assessment:   1. Encounter for annual routine gynecological examination   2. Encounter to establish care with new doctor   3. Screening for diabetes mellitus   4. Screening cholesterol level   5. Screening for thyroid disorder   6. Breast cancer screening by mammogram   7. Screen for STD (sexually transmitted disease)   8. Vaginal atrophy      Plan:  Pap:  Up to date. Due in 1 year.  Mammogram: Ordered Colon Screening:   Up to date Labs: Ordered.  Routine preventative health maintenance measures emphasized: Exercise/Diet/Weight control, Tobacco Warnings, Alcohol/Substance use risks, Stress Management, Peer Pressure Issues, Discussed recommended vaccinations, flu and Tdap, patient declines. Also discussed need for Shingles vaccine.  STD screening (onetime life screening of Hepatitis C and HIV) Vaginal atrophy mild, not bothersome to patient.  Return to [place], MD [place]

## 2022-07-05 LAB — CBC
Hematocrit: 40.8 % (ref 34.0–46.6)
Hemoglobin: 13.6 g/dL (ref 11.1–15.9)
MCH: 31.3 pg (ref 26.6–33.0)
MCHC: 33.3 g/dL (ref 31.5–35.7)
MCV: 94 fL (ref 79–97)
Platelets: 221 10*3/uL (ref 150–450)
RBC: 4.34 x10E6/uL (ref 3.77–5.28)
RDW: 12 % (ref 11.7–15.4)
WBC: 4.1 10*3/uL (ref 3.4–10.8)

## 2022-07-05 LAB — RPR: RPR Ser Ql: NONREACTIVE

## 2022-07-05 LAB — LIPID PANEL
Chol/HDL Ratio: 2.1 ratio (ref 0.0–4.4)
Cholesterol, Total: 198 mg/dL (ref 100–199)
HDL: 94 mg/dL (ref >39)
LDL Chol Calc (NIH): 89 mg/dL (ref 0–99)
Triglycerides: 86 mg/dL (ref 0–149)
VLDL Cholesterol Cal: 15 mg/dL (ref 5–40)

## 2022-07-05 LAB — COMPREHENSIVE METABOLIC PANEL
ALT: 15 IU/L (ref 0–32)
AST: 22 IU/L (ref 0–40)
Albumin/Globulin Ratio: 2.3 — ABNORMAL HIGH (ref 1.2–2.2)
Albumin: 4.9 g/dL (ref 3.8–4.9)
Alkaline Phosphatase: 59 IU/L (ref 44–121)
BUN/Creatinine Ratio: 16 (ref 9–23)
BUN: 12 mg/dL (ref 6–24)
Bilirubin Total: 1 mg/dL (ref 0.0–1.2)
CO2: 27 mmol/L (ref 20–29)
Calcium: 9.8 mg/dL (ref 8.7–10.2)
Chloride: 102 mmol/L (ref 96–106)
Creatinine, Ser: 0.77 mg/dL (ref 0.57–1.00)
Globulin, Total: 2.1 g/dL (ref 1.5–4.5)
Glucose: 87 mg/dL (ref 70–99)
Potassium: 4.3 mmol/L (ref 3.5–5.2)
Sodium: 140 mmol/L (ref 134–144)
Total Protein: 7 g/dL (ref 6.0–8.5)
eGFR: 90 mL/min/{1.73_m2} (ref >59)

## 2022-07-05 LAB — HEMOGLOBIN A1C
Est. average glucose Bld gHb Est-mCnc: 100 mg/dL
Hgb A1c MFr Bld: 5.1 % (ref 4.8–5.6)

## 2022-07-05 LAB — TSH: TSH: 1.65 u[IU]/mL (ref 0.450–4.500)

## 2022-07-05 LAB — HIV ANTIBODY (ROUTINE TESTING W REFLEX): HIV Screen 4th Generation wRfx: NONREACTIVE

## 2022-08-04 ENCOUNTER — Ambulatory Visit
Admission: RE | Admit: 2022-08-04 | Discharge: 2022-08-04 | Disposition: A | Discharge status: Home / Self Care | Payer: BC Managed Care – PPO | Source: Ambulatory Visit | Attending: Obstetrics and Gynecology | Admitting: Obstetrics and Gynecology

## 2022-08-04 DIAGNOSIS — Z1231 Encounter for screening mammogram for malignant neoplasm of breast: Secondary | ICD-10-CM | POA: Diagnosis not present

## 2022-08-04 DIAGNOSIS — Z01419 Encounter for gynecological examination (general) (routine) without abnormal findings: Secondary | ICD-10-CM

## 2022-11-23 ENCOUNTER — Ambulatory Visit: Payer: BC Managed Care – PPO | Admitting: Dermatology

## 2022-11-23 VITALS — BP 112/72 | HR 72

## 2022-11-23 DIAGNOSIS — D229 Melanocytic nevi, unspecified: Secondary | ICD-10-CM

## 2022-11-23 DIAGNOSIS — L918 Other hypertrophic disorders of the skin: Secondary | ICD-10-CM

## 2022-11-23 DIAGNOSIS — L814 Other melanin hyperpigmentation: Secondary | ICD-10-CM

## 2022-11-23 DIAGNOSIS — Z1283 Encounter for screening for malignant neoplasm of skin: Secondary | ICD-10-CM

## 2022-11-23 DIAGNOSIS — L578 Other skin changes due to chronic exposure to nonionizing radiation: Secondary | ICD-10-CM

## 2022-11-23 DIAGNOSIS — D1801 Hemangioma of skin and subcutaneous tissue: Secondary | ICD-10-CM

## 2022-11-23 DIAGNOSIS — L821 Other seborrheic keratosis: Secondary | ICD-10-CM

## 2022-11-23 NOTE — Patient Instructions (Signed)
Due to recent changes in healthcare laws, you may see results of your pathology and/or laboratory studies on MyChart before the doctors have had a chance to review them. We understand that in some cases there may be results that are confusing or concerning to you. Please understand that not all results are received at the same time and often the doctors may need to interpret multiple results in order to provide you with the best plan of care or course of treatment. Therefore, we ask that you please give us 2 business days to thoroughly review all your results before contacting the office for clarification. Should we see a critical lab result, you will be contacted sooner.   If You Need Anything After Your Visit  If you have any questions or concerns for your doctor, please call our main line at [phone] and press option 4 to reach your doctor's medical assistant. If no one answers, please leave a voicemail as directed and we will return your call as soon as possible. Messages left after 4 pm will be answered the following business day.   You may also send us a message via MyChart. We typically respond to MyChart messages within 1-2 business days.  For prescription refills, please ask your pharmacy to contact our office. Our fax number is [phone].  If you have an urgent issue when the clinic is closed that cannot wait until the next business day, you can page your doctor at the number below.    Please note that while we do our best to be available for urgent issues outside of office hours, we are not available 24/7.   If you have an urgent issue and are unable to reach us, you may choose to seek medical care at your doctor's office, retail clinic, urgent care center, or emergency room.  If you have a medical emergency, please immediately call 911 or go to the emergency department.  Pager Numbers  - Dr. Kowalski: [phone]  - Dr. Moye: [phone]  - Dr. Stewart:  [phone]  In the event of inclement weather, please call our main line at [phone] for an update on the status of any delays or closures.  Dermatology Medication Tips: Please keep the boxes that topical medications come in in order to help keep track of the instructions about where and how to use these. Pharmacies typically print the medication instructions only on the boxes and not directly on the medication tubes.   If your medication is too expensive, please contact our office at [phone] option 4 or send us a message through MyChart.   We are unable to tell what your co-pay for medications will be in advance as this is different depending on your insurance coverage. However, we may be able to find a substitute medication at lower cost or fill out paperwork to get insurance to cover a needed medication.   If a prior authorization is required to get your medication covered by your insurance company, please allow us 1-2 business days to complete this process.  Drug prices often vary depending on where the prescription is filled and some pharmacies may offer cheaper prices.  The website www.goodrx.com contains coupons for medications through different pharmacies. The prices here do not account for what the cost may be with help from insurance (it may be cheaper with your insurance), but the website can give you the price if you did not use any insurance.  - You can print the associated coupon and take it with   your prescription to the pharmacy.  - You may also stop by our office during regular business hours and pick up a GoodRx coupon card.  - If you need your prescription sent electronically to a different pharmacy, notify our office through [facility] MyChart or by phone at [phone] option 4.     Si Usted Necesita Algo Despus de Su Visita  Tambin puede enviarnos un mensaje a travs de MyChart. Por lo general respondemos a los mensajes de MyChart en el transcurso de 1 a 2  das hbiles.  Para renovar recetas, por favor pida a su farmacia que se ponga en contacto con nuestra oficina. Nuestro nmero de fax es el [phone].  Si tiene un asunto urgente cuando la clnica est cerrada y que no puede esperar hasta el siguiente da hbil, puede llamar/localizar a su doctor(a) al nmero que aparece a continuacin.   Por favor, tenga en cuenta que aunque hacemos todo lo posible para estar disponibles para asuntos urgentes fuera del horario de oficina, no estamos disponibles las 24 horas del da, los 7 das de la semana.   Si tiene un problema urgente y no puede comunicarse con nosotros, puede optar por buscar atencin mdica  en el consultorio de su doctor(a), en una clnica privada, en un centro de atencin urgente o en una sala de emergencias.  Si tiene una emergencia mdica, por favor llame inmediatamente al 911 o vaya a la sala de emergencias.  Nmeros de bper  - Dr. Kowalski: [phone]  - Dra. Moye: [phone]  - Dra. Stewart: [phone]  En caso de inclemencias del tiempo, por favor llame a nuestra lnea principal al [phone] para una actualizacin sobre el estado de cualquier retraso o cierre.  Consejos para la medicacin en dermatologa: Por favor, guarde las cajas en las que vienen los medicamentos de uso tpico para ayudarle a seguir las instrucciones sobre dnde y cmo usarlos. Las farmacias generalmente imprimen las instrucciones del medicamento slo en las cajas y no directamente en los tubos del medicamento.   Si su medicamento es muy caro, por favor, pngase en contacto con nuestra oficina llamando al [phone] y presione la opcin 4 o envenos un mensaje a travs de MyChart.   No podemos decirle cul ser su copago por los medicamentos por adelantado ya que esto es diferente dependiendo de la cobertura de su seguro. Sin embargo, es posible que podamos encontrar un medicamento sustituto a menor costo o llenar un formulario para que el  seguro cubra el medicamento que se considera necesario.   Si se requiere una autorizacin previa para que su compaa de seguros cubra su medicamento, por favor permtanos de 1 a 2 das hbiles para completar este proceso.  Los precios de los medicamentos varan con frecuencia dependiendo del lugar de dnde se surte la receta y alguna farmacias pueden ofrecer precios ms baratos.  El sitio web www.goodrx.com tiene cupones para medicamentos de diferentes farmacias. Los precios aqu no tienen en cuenta lo que podra costar con la ayuda del seguro (puede ser ms barato con su seguro), pero el sitio web puede darle el precio si no utiliz ningn seguro.  - Puede imprimir el cupn correspondiente y llevarlo con su receta a la farmacia.  - Tambin puede pasar por nuestra oficina durante el horario de atencin regular y recoger una tarjeta de cupones de GoodRx.  - Si necesita que su receta se enve electrnicamente a una farmacia diferente, informe a nuestra oficina a travs de MyChart de [facility]   o por telfono llamando al [phone] y presione la opcin 4.  

## 2022-11-23 NOTE — Progress Notes (Signed)
   Follow-Up Visit   Subjective  Michele Rodriguez is a 57 y.o. female who presents for the following: Skin Cancer Screening and Full Body Skin Exam, hx of precancers.   The patient presents for Total-Body Skin Exam (TBSE) for skin cancer screening and mole check. The patient has spots, moles and lesions to be evaluated, some may be new or changing and the patient has concerns that these could be cancer.    The following portions of the chart were reviewed this encounter and updated as appropriate: medications, allergies, medical history  Review of Systems:  No other skin or systemic complaints except as noted in HPI or Assessment and Plan.  Objective  Well appearing patient in no apparent distress; mood and affect are within normal limits.  A full examination was performed including scalp, head, eyes, ears, nose, lips, neck, chest, axillae, abdomen, back, buttocks, bilateral upper extremities, bilateral lower extremities, hands, feet, fingers, toes, fingernails, and toenails. All findings within normal limits unless otherwise noted below.   Relevant physical exam findings are noted in the Assessment and Plan.    Assessment & Plan   LENTIGINES, SEBORRHEIC KERATOSES, HEMANGIOMAS - Benign normal skin lesions - Benign-appearing - Call for any changes  MELANOCYTIC NEVI - Tan-brown and/or pink-flesh-colored symmetric macules and papules - Benign appearing on exam today - Observation - Call clinic for new or changing moles - Recommend daily use of broad spectrum spf 30+ sunscreen to sun-exposed areas.   ACTINIC DAMAGE - Chronic condition, secondary to cumulative UV/sun exposure - diffuse scaly erythematous macules with underlying dyspigmentation - Recommend daily broad spectrum sunscreen SPF 30+ to sun-exposed areas, reapply every 2 hours as needed.  - Staying in the shade or wearing long sleeves, sun glasses (UVA+UVB protection) and wide brim hats (4-inch brim around the entire  circumference of the hat) are also recommended for sun protection.  - Call for new or changing lesions.  Acrochordons (Skin Tags) Neck  - Fleshy, skin-colored pedunculated papules - Benign appearing.  - Observe. - If desired, they can be removed with an in office procedure that is not covered by insurance. - Please call the clinic if you notice any new or changing lesions.    SKIN CANCER SCREENING PERFORMED TODAY.   Return in about 1 year (around 11/23/2023) for TBSE, hx of Aks .  IMarye Round, CMA, am acting as scribe for Sarina Ser, MD .   Documentation: I have reviewed the above documentation for accuracy and completeness, and I agree with the above.  Sarina Ser, MD

## 2022-11-28 ENCOUNTER — Encounter: Payer: Self-pay | Admitting: Dermatology

## 2023-02-12 IMAGING — MG MM DIGITAL SCREENING BILAT W/ TOMO AND CAD
6 of 10 series · 6 of 30 positions shown · non-contrast
Comparison: Previous exam(s).

CLINICAL DATA: Screening.

EXAM:
DIGITAL SCREENING BILATERAL MAMMOGRAM WITH TOMOSYNTHESIS AND CAD
TECHNIQUE: Bilateral screening digital craniocaudal and mediolateral oblique
mammograms were obtained. Bilateral screening digital breast
tomosynthesis was performed. The images were evaluated with
computer-aided detection.

[L MLO synth-2D (1 of 2)]
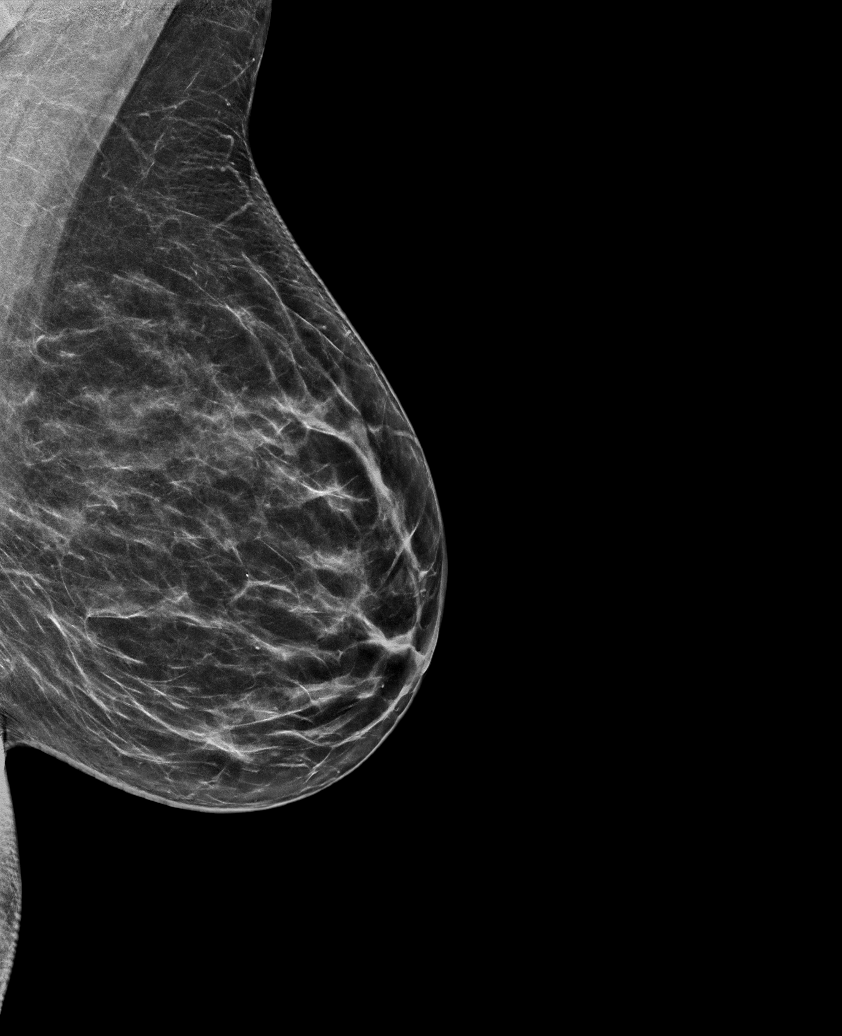

[R MLO synth-2D]
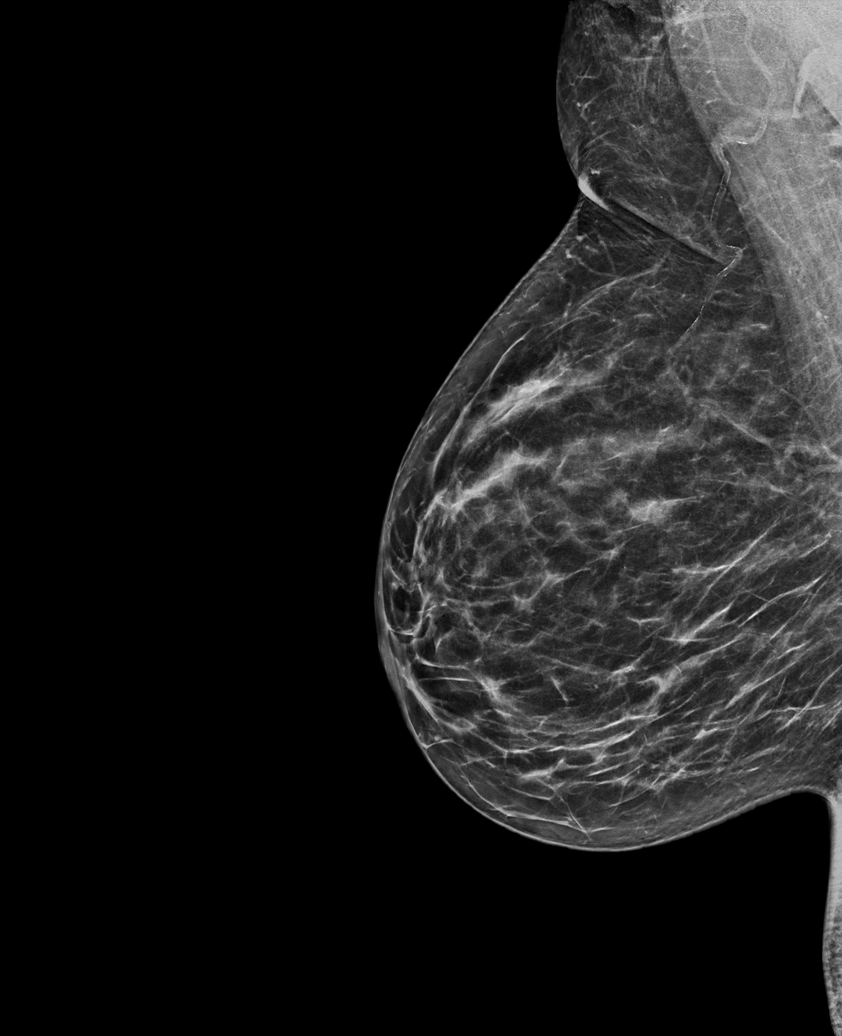

[L MLO synth-2D (2 of 2)]
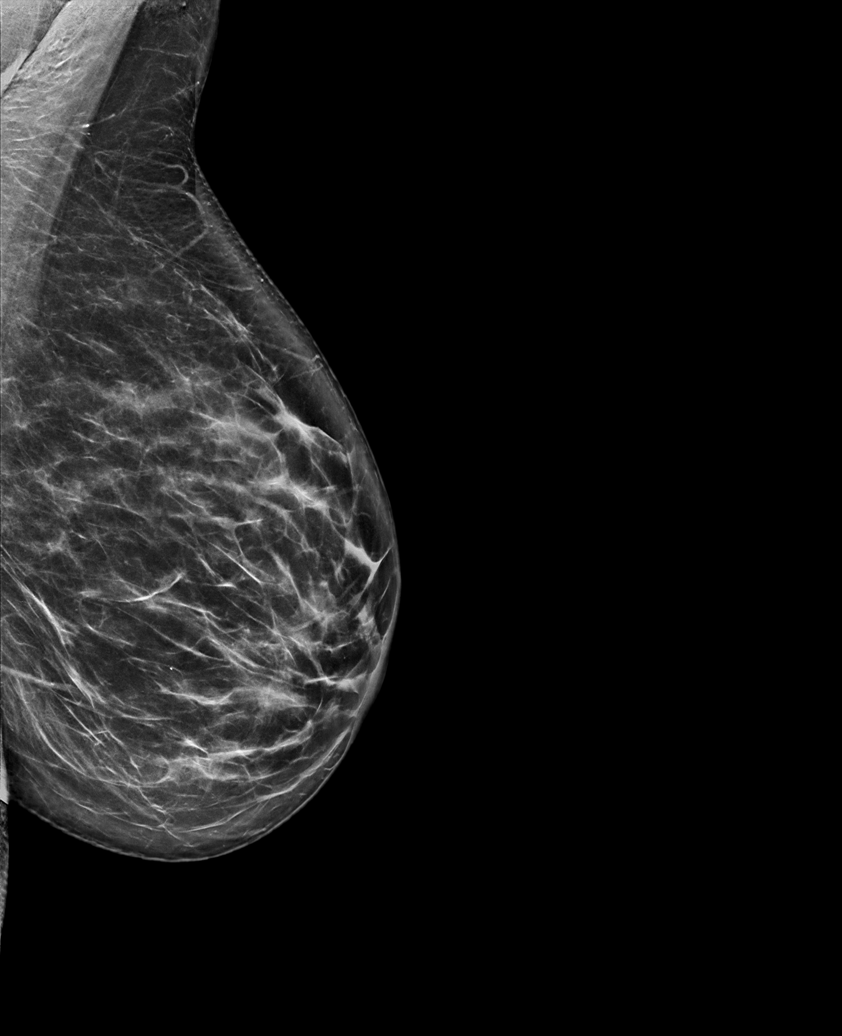

[R CC synth-2D]
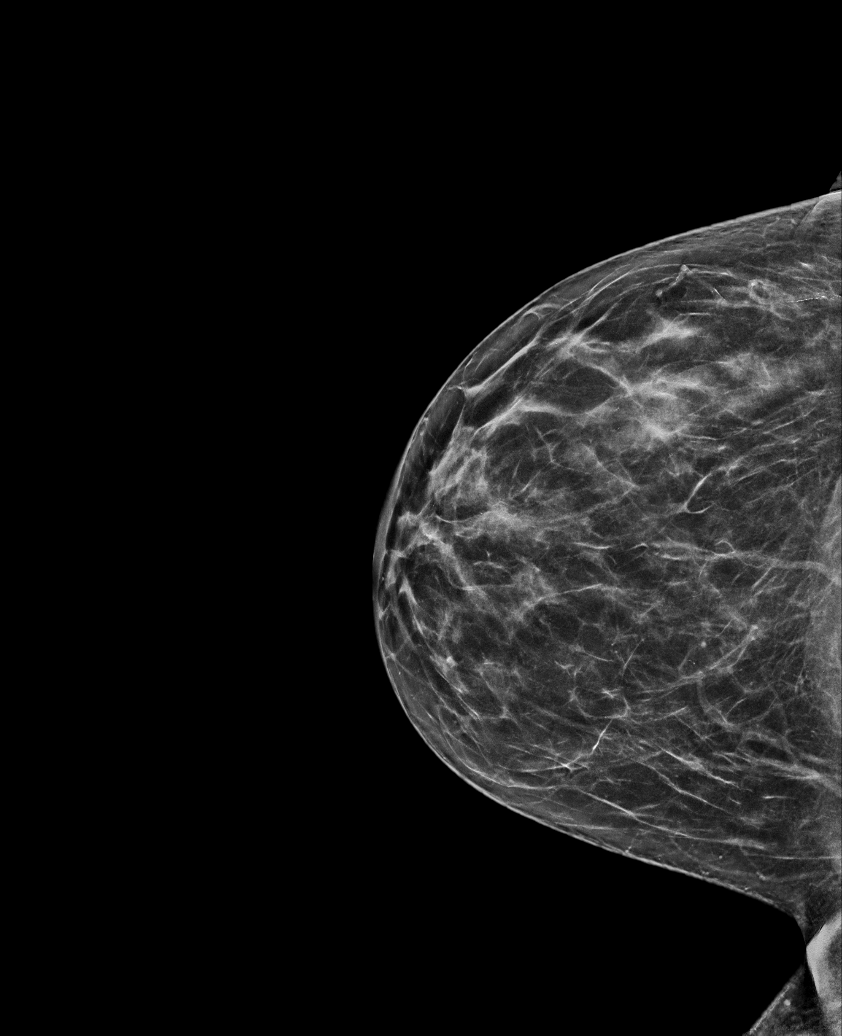

[L CC synth-2D]
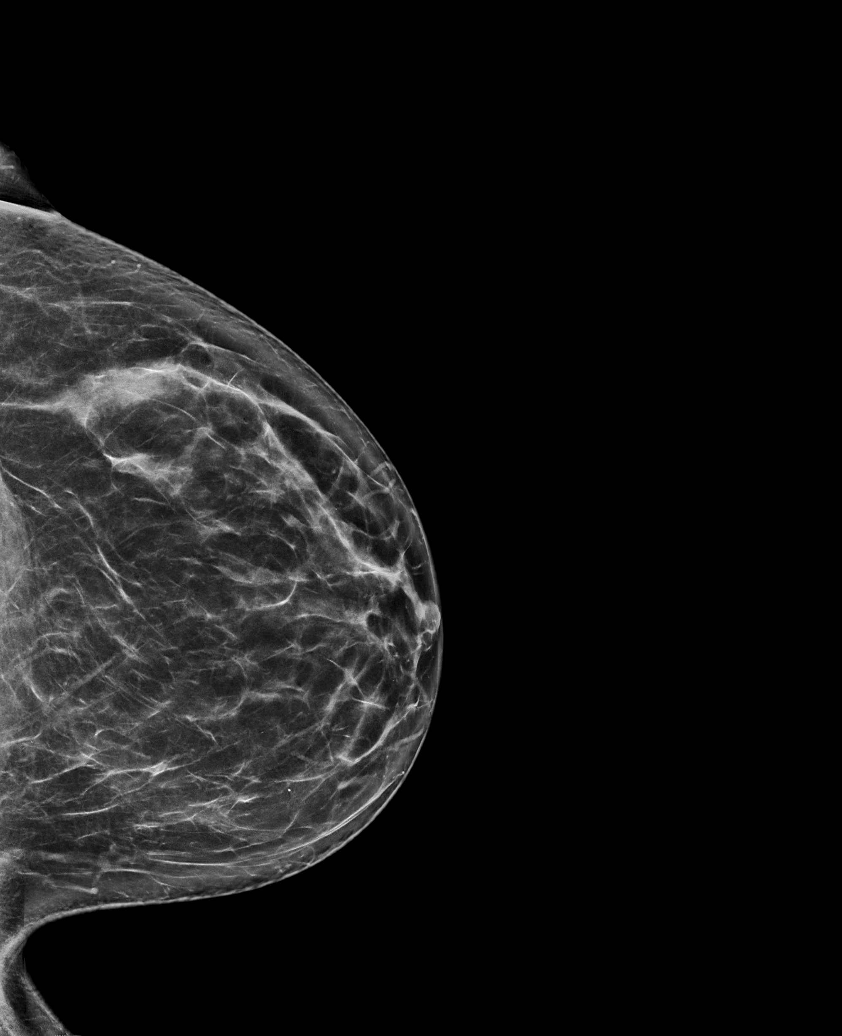

[L MLO tomo · tomo slice 35/69.0]
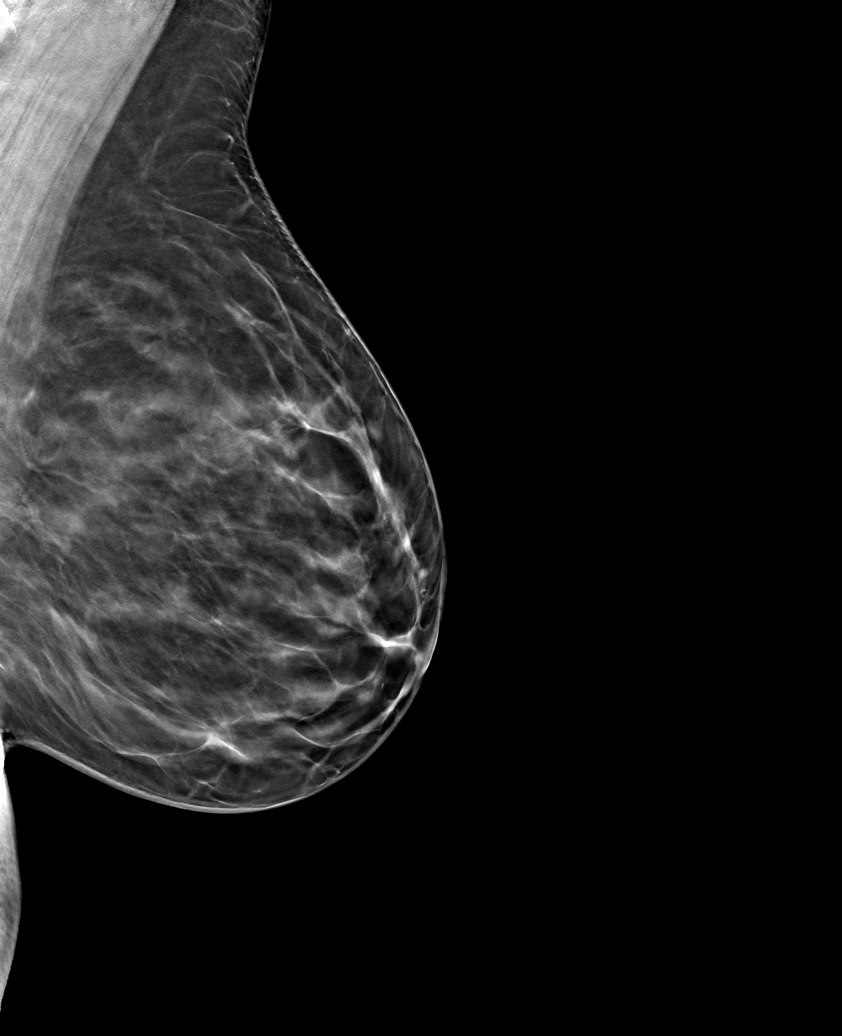

[6 of 30 positions shown; findings below may reference images not displayed]

ACR Breast Density Category c: The breast tissue is heterogeneously
dense, which may obscure small masses.
FINDINGS: There are no findings suspicious for malignancy.
IMPRESSION: No mammographic evidence of malignancy. A result letter of this
screening mammogram will be mailed directly to the patient.

RECOMMENDATION:
Screening mammogram in one year. (Code:Q3-W-BC3)

BI-RADS CATEGORY  1: Negative.

## 2023-07-04 DIAGNOSIS — K219 Gastro-esophageal reflux disease without esophagitis: Secondary | ICD-10-CM | POA: Insufficient documentation

## 2023-07-05 ENCOUNTER — Other Ambulatory Visit: Payer: Self-pay | Admitting: Obstetrics and Gynecology

## 2023-07-05 DIAGNOSIS — Z1231 Encounter for screening mammogram for malignant neoplasm of breast: Secondary | ICD-10-CM

## 2023-08-09 ENCOUNTER — Ambulatory Visit
Admission: RE | Admit: 2023-08-09 | Discharge: 2023-08-09 | Disposition: A | Discharge status: Home / Self Care | Payer: BC Managed Care – PPO | Source: Ambulatory Visit | Attending: Obstetrics and Gynecology | Admitting: Obstetrics and Gynecology

## 2023-08-09 DIAGNOSIS — Z1231 Encounter for screening mammogram for malignant neoplasm of breast: Secondary | ICD-10-CM | POA: Diagnosis present

## 2023-09-19 ENCOUNTER — Encounter: Payer: Self-pay | Admitting: Obstetrics and Gynecology

## 2023-09-19 ENCOUNTER — Ambulatory Visit: Payer: 59 | Admitting: Obstetrics and Gynecology

## 2023-09-19 VITALS — BP 121/85 | Resp 16 | Ht 64.0 in | Wt 154.0 lb

## 2023-09-19 DIAGNOSIS — Z1231 Encounter for screening mammogram for malignant neoplasm of breast: Secondary | ICD-10-CM

## 2023-09-19 DIAGNOSIS — Z01419 Encounter for gynecological examination (general) (routine) without abnormal findings: Secondary | ICD-10-CM | POA: Diagnosis not present

## 2023-09-19 DIAGNOSIS — Z23 Encounter for immunization: Secondary | ICD-10-CM

## 2023-09-19 DIAGNOSIS — Z1159 Encounter for screening for other viral diseases: Secondary | ICD-10-CM

## 2023-09-19 DIAGNOSIS — Z131 Encounter for screening for diabetes mellitus: Secondary | ICD-10-CM

## 2023-09-19 DIAGNOSIS — Z13220 Encounter for screening for lipoid disorders: Secondary | ICD-10-CM

## 2023-09-19 DIAGNOSIS — Z1329 Encounter for screening for other suspected endocrine disorder: Secondary | ICD-10-CM

## 2023-09-19 NOTE — Progress Notes (Signed)
ANNUAL PREVENTATIVE CARE GYNECOLOGY  ENCOUNTER NOTE  Subjective:       Michele Rodriguez is a 58 y.o. [phone] female here for a routine annual gynecologic exam. The patient is sexually active. The patient is not taking hormone replacement therapy. Patient denies post-menopausal vaginal bleeding. The patient wears seatbelts: yes. The patient participates in regular exercise: yes. Has the patient ever been transfused or tattooed?: yes. The patient reports that there is not domestic violence in her life.   Current complaints: 1.  None    Gynecologic History No LMP recorded. Patient is postmenopausal. Contraception: post menopausal status Last Pap: 06/20/2021. Results were: normal Last mammogram: 08/09/2023. Results were: normal Last Colonoscopy: 07/04/2023: Repeat colonoscopy for surveillance based on pathology results, likely in 7years. Last Dexa Scan: Never Done    Obstetric History OB History  Gravida Para Term Preterm AB Living  2 2 2   2   SAB IAB Ectopic Multiple Live Births      2    # Outcome Date GA Lbr Len/2nd Weight Sex Type Anes PTL Lv  2 Term 01/23/91    Judie Petit Vag-Spont  Y LIV  1 Term 10/26/89    F Vag-Spont   LIV    Past Medical History:  Diagnosis Date   Actinic keratosis    BRCA negative 05/2019   MyRisk neg; IBIS=8.2%/riskscore=8.0%   Family history of colon cancer    Family history of pancreatic cancer     Family History  Problem Relation Age of Onset   Diabetes Father    Hypotension Father    Prostate cancer Father 45   Lung cancer Sister    Colon cancer Paternal Grandfather    Pancreatic cancer Maternal Aunt    Colon cancer Maternal Grandfather    Breast cancer Neg Hx     Past Surgical History:  Procedure Laterality Date   COLONOSCOPY WITH PROPOFOL N/A 05/14/2015   Procedure: COLONOSCOPY WITH PROPOFOL;  Surgeon: Elnita Maxwell, MD;  Location: [facility] ENDOSCOPY;  Service: Endoscopy;  Laterality: N/A;   FOOT SURGERY Bilateral 1983   to align big toe,  pt states it might been "bunion surgery"    KNEE ARTHROSCOPY Bilateral 1983   "to align knee cap" pt states   TUBAL LIGATION  1992    Social History   Socioeconomic History   Marital status: Single    Spouse name: Not on file   Number of children: Not on file   Years of education: Not on file   Highest education level: Not on file  Occupational History   Not on file  Tobacco Use   Smoking status: Never   Smokeless tobacco: Never  Vaping Use   Vaping status: Never Used  Substance and Sexual Activity   Alcohol use: Yes    Alcohol/week: 11.0 standard drinks of alcohol    Types: 11 Cans of beer per week   Drug use: No   Sexual activity: Yes    Birth control/protection: Surgical  Other Topics Concern   Not on file  Social History Narrative   Not on file   Social Drivers of Health   Financial Resource Strain: Low Risk  (03/05/2023)   Received from [facility]   Overall Financial Resource Strain (CARDIA)    Difficulty of Paying Living Expenses: Not very hard  Food Insecurity: No Food Insecurity (03/05/2023)   Received from [facility]   Hunger Vital Sign    Worried About Running Out of Food in  the Last Year: Never true    Ran Out of Food in the Last Year: Never true  Transportation Needs: No Transportation Needs (03/05/2023)   Received from [facility] - Transportation    In the past 12 months, has lack of transportation kept you from medical appointments or from getting medications?: No    Lack of Transportation (Non-Medical): No  Physical Activity: Not on file  Stress: Not on file  Social Connections: Not on file  Intimate Partner Violence: Not on file    Current Outpatient Medications on File Prior to Visit  Medication Sig Dispense Refill   CALCIUM PO Take by mouth.     Multiple Vitamins-Minerals (ONE-A-DAY WOMENS 50+ PO) Take by mouth.     VITAMIN D PO Take by mouth.     No current  facility-administered medications on file prior to visit.    No Known Allergies    Review of Systems ROS Review of Systems - General ROS: negative for - chills, fatigue, fever, hot flashes, night sweats, weight gain or weight loss Psychological ROS: negative for - anxiety, decreased libido, depression, mood swings, physical abuse or sexual abuse Ophthalmic ROS: negative for - blurry vision, eye pain or loss of vision ENT ROS: negative for - headaches, hearing change, visual changes or vocal changes Allergy and Immunology ROS: negative for - hives, itchy/watery eyes or seasonal allergies Hematological and Lymphatic ROS: negative for - bleeding problems, bruising, swollen lymph nodes or weight loss Endocrine ROS: negative for - galactorrhea, hair pattern changes, hot flashes, malaise/lethargy, mood swings, palpitations, polydipsia/polyuria, skin changes, temperature intolerance or unexpected weight changes Breast ROS: negative for - new or changing breast lumps or nipple discharge Respiratory ROS: negative for - cough or shortness of breath Cardiovascular ROS: negative for - chest pain, irregular heartbeat, palpitations or shortness of breath Gastrointestinal ROS: no abdominal pain, change in bowel habits, or black or bloody stools Genito-Urinary ROS: no dysuria, trouble voiding, or hematuria Musculoskeletal ROS: negative for - joint pain or joint stiffness Neurological ROS: negative for - bowel and bladder control changes Dermatological ROS: negative for rash and skin lesion changes   Objective:   BP 121/85   Resp 16   Ht 5\' 4"  (1.626 m)   Wt 154 lb (69.9 kg)   BMI 26.43 kg/m  CONSTITUTIONAL: Well-developed, well-nourished female in no acute distress.  PSYCHIATRIC: Normal mood and affect. Normal behavior. Normal judgment and thought content. NEUROLGIC: Alert and oriented to person, place, and time. Normal muscle tone coordination. No cranial nerve deficit noted. HENT:   Normocephalic, atraumatic, External right and left ear normal. Oropharynx is clear and moist EYES: Conjunctivae and EOM are normal. Pupils are equal, round, and reactive to light. No scleral icterus.  NECK: Normal range of motion, supple, no masses.  Normal thyroid.  SKIN: Skin is warm and dry. No rash noted. Not diaphoretic. No erythema. No pallor. CARDIOVASCULAR: Normal heart rate noted, regular rhythm, no murmur. RESPIRATORY: Clear to auscultation bilaterally. Effort and breath sounds normal, no problems with respiration noted. BREASTS: Symmetric in size. No masses, skin changes, nipple drainage, or lymphadenopathy. ABDOMEN: Soft, normal bowel sounds, no distention noted.  No tenderness, rebound or guarding.  BLADDER: Normal PELVIC:  Bladder no bladder distension noted  Urethra: normal appearing urethra with no masses, tenderness or lesions  Vulva: normal appearing vulva with no masses, tenderness or lesions  Vagina: normal appearing vagina with normal color and discharge, no lesions  Cervix: normal appearing cervix without  discharge or lesions  Uterus: uterus is normal size, shape, consistency and nontender  Adnexa: normal adnexa in size, nontender and no masses  RV: External Exam NormaI, No Rectal Masses, and Normal Sphincter tone  MUSCULOSKELETAL: Normal range of motion. No tenderness.  No cyanosis, clubbing, or edema.  2+ distal pulses. LYMPHATIC: No Axillary, Supraclavicular, or Inguinal Adenopathy.   Labs: Lab Results  Component Value Date   WBC 4.1 07/04/2022   HGB 13.6 07/04/2022   HCT 40.8 07/04/2022   MCV 94 07/04/2022   PLT 221 07/04/2022    Lab Results  Component Value Date   CREATININE 0.77 07/04/2022   BUN 12 07/04/2022   NA 140 07/04/2022   K 4.3 07/04/2022   CL 102 07/04/2022   CO2 27 07/04/2022    Lab Results  Component Value Date   ALT 15 07/04/2022   AST 22 07/04/2022   ALKPHOS 59 07/04/2022   BILITOT 1.0 07/04/2022    Lab Results  Component  Value Date   CHOL 198 07/04/2022   HDL 94 07/04/2022   LDLCALC 89 07/04/2022   TRIG 86 07/04/2022   CHOLHDL 2.1 07/04/2022    Lab Results  Component Value Date   TSH 1.650 07/04/2022    Lab Results  Component Value Date   HGBA1C 5.1 07/04/2022     Assessment:   1. Encounter for well woman exam with routine gynecological exam   2. Screening cholesterol level   3. Screening for diabetes mellitus (DM)   4. Screening for thyroid disorder   5. Breast cancer screening by mammogram   6. Need for hepatitis C screening test   7. Need for Tdap vaccination      Plan:  Pap:  UTD .  Due in October 2025.  Mammogram: Ordered.  Colon Screening:   UTD .  Labs: See orders.  Routine preventative health maintenance measures emphasized:  Self Breast Exam and Exercise/Diet/Weight control.  Flu vaccine status: Declined.  COVID Vaccination status: Declined.  Reviewed need for Tdap vaccination, ok to receive today.  Discussed need for 1 time low risk Hepatitis C screening, patient ok to perform.  Return to Clinic - 1 Year   Hildred Laser, MD [facility]

## 2023-09-20 ENCOUNTER — Encounter: Payer: Self-pay | Admitting: Obstetrics and Gynecology

## 2023-09-20 LAB — CBC
Hematocrit: 39.8 % (ref 34.0–46.6)
Hemoglobin: 13.3 g/dL (ref 11.1–15.9)
MCH: 31.4 pg (ref 26.6–33.0)
MCHC: 33.4 g/dL (ref 31.5–35.7)
MCV: 94 fL (ref 79–97)
Platelets: 256 10*3/uL (ref 150–450)
RBC: 4.23 x10E6/uL (ref 3.77–5.28)
RDW: 12.3 % (ref 11.7–15.4)
WBC: 4 10*3/uL (ref 3.4–10.8)

## 2023-09-20 LAB — LIPID PANEL
Chol/HDL Ratio: 2.7 {ratio} (ref 0.0–4.4)
Cholesterol, Total: 225 mg/dL — ABNORMAL HIGH (ref 100–199)
HDL: 82 mg/dL (ref >39)
LDL Chol Calc (NIH): 129 mg/dL — ABNORMAL HIGH (ref 0–99)
Triglycerides: 83 mg/dL (ref 0–149)
VLDL Cholesterol Cal: 14 mg/dL (ref 5–40)

## 2023-09-20 LAB — COMPREHENSIVE METABOLIC PANEL
ALT: 22 [IU]/L (ref 0–32)
AST: 24 [IU]/L (ref 0–40)
Albumin: 4.5 g/dL (ref 3.8–4.9)
Alkaline Phosphatase: 73 [IU]/L (ref 44–121)
BUN/Creatinine Ratio: 8 — ABNORMAL LOW (ref 9–23)
BUN: 7 mg/dL (ref 6–24)
Bilirubin Total: 1.3 mg/dL — ABNORMAL HIGH (ref 0.0–1.2)
CO2: 24 mmol/L (ref 20–29)
Calcium: 9.8 mg/dL (ref 8.7–10.2)
Chloride: 102 mmol/L (ref 96–106)
Creatinine, Ser: 0.84 mg/dL (ref 0.57–1.00)
Globulin, Total: 2.2 g/dL (ref 1.5–4.5)
Glucose: 87 mg/dL (ref 70–99)
Potassium: 3.9 mmol/L (ref 3.5–5.2)
Sodium: 140 mmol/L (ref 134–144)
Total Protein: 6.7 g/dL (ref 6.0–8.5)
eGFR: 80 mL/min/{1.73_m2} (ref >59)

## 2023-09-20 LAB — TSH: TSH: 2 u[IU]/mL (ref 0.450–4.500)

## 2023-09-20 LAB — HEMOGLOBIN A1C
Est. average glucose Bld gHb Est-mCnc: 103 mg/dL
Hgb A1c MFr Bld: 5.2 % (ref 4.8–5.6)

## 2023-11-29 ENCOUNTER — Ambulatory Visit: Payer: BC Managed Care – PPO | Admitting: Dermatology

## 2023-12-11 ENCOUNTER — Ambulatory Visit: Admitting: Dermatology

## 2023-12-11 ENCOUNTER — Encounter: Payer: Self-pay | Admitting: Dermatology

## 2023-12-11 DIAGNOSIS — W908XXA Exposure to other nonionizing radiation, initial encounter: Secondary | ICD-10-CM | POA: Diagnosis not present

## 2023-12-11 DIAGNOSIS — L578 Other skin changes due to chronic exposure to nonionizing radiation: Secondary | ICD-10-CM

## 2023-12-11 DIAGNOSIS — L821 Other seborrheic keratosis: Secondary | ICD-10-CM

## 2023-12-11 DIAGNOSIS — L814 Other melanin hyperpigmentation: Secondary | ICD-10-CM | POA: Diagnosis not present

## 2023-12-11 DIAGNOSIS — D229 Melanocytic nevi, unspecified: Secondary | ICD-10-CM

## 2023-12-11 DIAGNOSIS — D1801 Hemangioma of skin and subcutaneous tissue: Secondary | ICD-10-CM

## 2023-12-11 DIAGNOSIS — Z1283 Encounter for screening for malignant neoplasm of skin: Secondary | ICD-10-CM | POA: Diagnosis not present

## 2023-12-11 DIAGNOSIS — Z872 Personal history of diseases of the skin and subcutaneous tissue: Secondary | ICD-10-CM

## 2023-12-11 NOTE — Progress Notes (Signed)
   Follow-Up Visit   Subjective  Michele Rodriguez is a 58 y.o. female who presents for the following: Skin Cancer Screening and Full Body Skin Exam, hx of precancers.   The patient presents for Total-Body Skin Exam (TBSE) for skin cancer screening and mole check. The patient has spots, moles and lesions to be evaluated, some may be new or changing and the patient may have concern these could be cancer.  The following portions of the chart were reviewed this encounter and updated as appropriate: medications, allergies, medical history  Review of Systems:  No other skin or systemic complaints except as noted in HPI or Assessment and Plan.  Objective  Well appearing patient in no apparent distress; mood and affect are within normal limits.  A full examination was performed including scalp, head, eyes, ears, nose, lips, neck, chest, axillae, abdomen, back, buttocks, bilateral upper extremities, bilateral lower extremities, hands, feet, fingers, toes, fingernails, and toenails. All findings within normal limits unless otherwise noted below.   Relevant physical exam findings are noted in the Assessment and Plan.   Assessment & Plan   SKIN CANCER SCREENING PERFORMED TODAY.  ACTINIC DAMAGE - Chronic condition, secondary to cumulative UV/sun exposure - diffuse scaly erythematous macules with underlying dyspigmentation - Recommend daily broad spectrum sunscreen SPF 30+ to sun-exposed areas, reapply every 2 hours as needed.  - Staying in the shade or wearing long sleeves, sun glasses (UVA+UVB protection) and wide brim hats (4-inch brim around the entire circumference of the hat) are also recommended for sun protection.  - Call for new or changing lesions.  LENTIGINES, SEBORRHEIC KERATOSES, HEMANGIOMAS - Benign normal skin lesions - Benign-appearing - Call for any changes  MELANOCYTIC NEVI - Tan-brown and/or pink-flesh-colored symmetric macules and papules - Benign appearing on exam today -  Observation - Call clinic for new or changing moles - Recommend daily use of broad spectrum spf 30+ sunscreen to sun-exposed areas.   HISTORY OF ACTINIC KERATOSIS  Clear today.   Return in about 1 year (around 12/10/2024) for TBSE, hx of precancers .  IClara Crisp, CMA, am acting as scribe for Celine Collard, MD .   Documentation: I have reviewed the above documentation for accuracy and completeness, and I agree with the above.  Celine Collard, MD

## 2023-12-11 NOTE — Patient Instructions (Addendum)
 Melanoma ABCDEs  Melanoma is the most dangerous type of skin cancer, and is the leading cause of death from skin disease.  You are more likely to develop melanoma if you: Have light-colored skin, light-colored eyes, or red or blond hair Spend a lot of time in the sun Tan regularly, either outdoors or in a tanning bed Have had blistering sunburns, especially during childhood Have a close family member who has had a melanoma Have atypical moles or large birthmarks  Early detection of melanoma is key since treatment is typically straightforward and cure rates are extremely high if we catch it early.   The first sign of melanoma is often a change in a mole or a new dark spot.  The ABCDE system is a way of remembering the signs of melanoma.  A for asymmetry:  The two halves do not match. B for border:  The edges of the growth are irregular. C for color:  A mixture of colors are present instead of an even brown color. D for diameter:  Melanomas are usually (but not always) greater than 6mm - the size of a pencil eraser. E for evolution:  The spot keeps changing in size, shape, and color.  Please check your skin once per month between visits. You can use a small mirror in front and a large mirror behind you to keep an eye on the back side or your body.   If you see any new or changing lesions before your next follow-up, please call to schedule a visit.  Please continue daily skin protection including broad spectrum sunscreen SPF 30+ to sun-exposed areas, reapplying every 2 hours as needed when you're outdoors.        Recommend daily broad spectrum sunscreen SPF 30+ to sun-exposed areas, reapply every 2 hours as needed. Call for new or changing lesions.  Staying in the shade or wearing long sleeves, sun glasses (UVA+UVB protection) and wide brim hats (4-inch brim around the entire circumference of the hat) are also recommended for sun protection.      Due to recent changes in healthcare  laws, you may see results of your pathology and/or laboratory studies on MyChart before the doctors have had a chance to review them. We understand that in some cases there may be results that are confusing or concerning to you. Please understand that not all results are received at the same time and often the doctors may need to interpret multiple results in order to provide you with the best plan of care or course of treatment. Therefore, we ask that you please give [place] 2 business days to thoroughly review all your results before contacting the office for clarification. Should we see a critical lab result, you will be contacted sooner.   If You Need Anything After Your Visit  If you have any questions or concerns for your doctor, please call our main line at [phone] and press option 4 to reach your doctor's medical assistant. If no one answers, please leave a voicemail as directed and we will return your call as soon as possible. Messages left after 4 pm will be answered the following business day.   You may also send [place] a message via MyChart. We typically respond to MyChart messages within 1-2 business days.  For prescription refills, please ask your pharmacy to contact our office. Our fax number is [phone].  If you have an urgent issue when the clinic is closed that cannot wait until the next business day, you  can page your doctor at the number below.    Please note that while we do our best to be available for urgent issues outside of office hours, we are not available 24/7.   If you have an urgent issue and are unable to reach [place], you may choose to seek medical care at your doctor's office, retail clinic, urgent care center, or emergency room.  If you have a medical emergency, please immediately call 911 or go to the emergency department.  Pager Numbers  - Dr. Gwen Pounds: [phone]  - Dr. Roseanne Reno: [phone]  - Dr. Katrinka Blazing: [phone]   In the event of inclement weather,  please call our main line at [phone] for an update on the status of any delays or closures.  Dermatology Medication Tips: Please keep the boxes that topical medications come in in order to help keep track of the instructions about where and how to use these. Pharmacies typically print the medication instructions only on the boxes and not directly on the medication tubes.   If your medication is too expensive, please contact our office at [phone] option 4 or send [place] a message through MyChart.   We are unable to tell what your co-pay for medications will be in advance as this is different depending on your insurance coverage. However, we may be able to find a substitute medication at lower cost or fill out paperwork to get insurance to cover a needed medication.   If a prior authorization is required to get your medication covered by your insurance company, please allow [place] 1-2 business days to complete this process.  Drug prices often vary depending on where the prescription is filled and some pharmacies may offer cheaper prices.  The website www.goodrx.com contains coupons for medications through different pharmacies. The prices here do not account for what the cost may be with help from insurance (it may be cheaper with your insurance), but the website can give you the price if you did not use any insurance.  - You can print the associated coupon and take it with your prescription to the pharmacy.  - You may also stop by our office during regular business hours and pick up a GoodRx coupon card.  - If you need your prescription sent electronically to a different pharmacy, notify our office through [facility] or by phone at [phone] option 4.     Si Usted Necesita Algo Despus de Su Visita  Tambin puede enviarnos un mensaje a travs de Clinical cytogeneticist. Por lo general respondemos a los mensajes de MyChart en el transcurso de 1 a 2 das hbiles.  Para renovar recetas, por favor  pida a su farmacia que se ponga en contacto con nuestra oficina. Annie Sable de fax es [place] [phone].  Si tiene un asunto urgente cuando la clnica est cerrada y que no puede esperar hasta el siguiente da hbil, puede llamar/localizar a su doctor(a) al nmero que aparece a continuacin.   Por favor, tenga en cuenta que aunque hacemos todo lo posible para estar disponibles para asuntos urgentes fuera del horario de [place], no estamos disponibles las 24 horas del da, los 7 [address]  [address] de la [place].   Si tiene un problema urgente y no puede comunicarse con nosotros, puede optar por buscar atencin mdica  en el consultorio de su doctor(a), en una clnica privada, en un centro de atencin urgente o en una sala de emergencias.  Si tiene una emergencia mdica, por favor llame inmediatamente al 911 o vaya a  la sala de emergencias.  Nmeros de bper  - Dr. Gwen Pounds: [phone]  - Dra. Roseanne Reno: [phone]  - Dr. Katrinka Blazing: [phone]   En caso de inclemencias del tiempo, por favor llame a Lacy Duverney principal al [phone] para una actualizacin sobre el [place] de cualquier retraso o cierre.  Consejos para la medicacin en dermatologa: Por favor, guarde las cajas en las que vienen los medicamentos de uso tpico para ayudarle a seguir las instrucciones sobre dnde y cmo usarlos. Las farmacias generalmente imprimen las instrucciones del medicamento slo en las cajas y no directamente en los tubos del [place].   Si su medicamento es muy caro, por favor, pngase en contacto con Rolm Gala llamando al [phone] y presione la opcin 4 o envenos un mensaje a travs de Clinical cytogeneticist.   No podemos decirle cul ser su copago por los medicamentos por adelantado ya que esto es diferente dependiendo de la cobertura de su seguro. Sin embargo, es posible que podamos encontrar un medicamento sustituto a Audiological scientist un formulario para que el seguro cubra el medicamento que se considera  necesario.   Si se requiere una autorizacin previa para que su compaa de seguros [place] su medicamento, por favor permtanos de 1 a 2 das hbiles para completar [address]  Los precios de los medicamentos varan con frecuencia dependiendo del Environmental consultant de dnde se surte la receta y alguna farmacias pueden ofrecer precios ms baratos.  El sitio web www.goodrx.com tiene cupones para medicamentos de Health and safety inspector. Los precios aqu no tienen en cuenta lo que podra costar con la ayuda del seguro (puede ser ms barato con su seguro), pero el sitio web puede darle el precio si no utiliz Tourist information centre manager.  - Puede imprimir el cupn correspondiente y llevarlo con su receta a la farmacia.  - Tambin puede pasar por nuestra oficina durante el horario de atencin regular y Education officer, museum una tarjeta de cupones de GoodRx.  - Si necesita que su receta se enve electrnicamente a una farmacia diferente, informe a nuestra oficina a travs de MyChart de [place] o por telfono llamando al [phone] y presione la opcin 4.

## 2024-06-25 ENCOUNTER — Other Ambulatory Visit: Payer: Self-pay | Admitting: Family Medicine

## 2024-06-25 DIAGNOSIS — Z1231 Encounter for screening mammogram for malignant neoplasm of breast: Secondary | ICD-10-CM

## 2024-08-11 ENCOUNTER — Ambulatory Visit
Admission: RE | Admit: 2024-08-11 | Discharge: 2024-08-11 | Disposition: A | Source: Ambulatory Visit | Attending: Family Medicine | Admitting: Family Medicine

## 2024-08-11 DIAGNOSIS — Z1231 Encounter for screening mammogram for malignant neoplasm of breast: Secondary | ICD-10-CM | POA: Insufficient documentation

## 2024-09-19 ENCOUNTER — Ambulatory Visit: Admitting: Registered Nurse

## 2024-09-19 ENCOUNTER — Encounter: Payer: Self-pay | Admitting: Registered Nurse

## 2024-09-19 VITALS — BP 107/72 | HR 57 | Ht 64.0 in | Wt 158.5 lb

## 2024-09-19 DIAGNOSIS — Z01419 Encounter for gynecological examination (general) (routine) without abnormal findings: Secondary | ICD-10-CM | POA: Diagnosis not present

## 2024-09-19 NOTE — Progress Notes (Deleted)
 "  ANNUAL GYNECOLOGICAL EXAM  HPI  Michele Rodriguez is a 59 y.o.-year-old [id number] who presents for an annual gynecological exam today.  She denies pelvic pain, dyspareunia, abnormal vaginal bleeding or discharge, and UTI symptoms. Patient states she has no concerns today.  Medical/Surgical History Past Medical History:  Diagnosis Date   Actinic keratosis    BRCA negative 05/2019   MyRisk neg; IBIS=8.2%/riskscore=8.0%   Family history of colon cancer    Family history of pancreatic cancer    Past Surgical History:  Procedure Laterality Date   COLONOSCOPY WITH PROPOFOL  N/A 05/14/2015   Procedure: COLONOSCOPY WITH PROPOFOL ;  Surgeon: Donnice Vaughn Manes, MD;  Location: [facility] ENDOSCOPY;  Service: Endoscopy;  Laterality: N/A;   FOOT SURGERY Bilateral 1983   to align big toe, pt states it might been bunion surgery    KNEE ARTHROSCOPY Bilateral 1983   to align knee cap pt states   TUBAL LIGATION  1992    Social History Lives with Boyfriend. Yes, Feels safe there Work: [employer], Production Manager at [employer] Exercise: x2 a week foor 1 hour Substances: ***EtOH, tobacco, vape, and recreational drugs  Social Drivers of Health   Tobacco Use: Low Risk (12/11/2023)   Patient History    Smoking Tobacco Use: Never    Smokeless Tobacco Use: Never    Passive Exposure: Not on file  Financial Resource Strain: Low Risk  (03/07/2024)   Received from [facility]   Overall Financial Resource Strain (CARDIA)    Difficulty of Paying Living Expenses: Not hard at all  Food Insecurity: No Food Insecurity (03/07/2024)   Received from [facility]   Epic    Within the past 12 months, you worried that your food would run out before you got the money to buy more.: Never true    Within the past 12 months, the food you bought just didn't last and you didn't have money to get more.: Never true  Transportation Needs: No Transportation Needs (03/07/2024)    Received from [facility] - Transportation    In the past 12 months, has lack of transportation kept you from medical appointments or from getting medications?: No    Lack of Transportation (Non-Medical): No  Physical Activity: Not on file  Stress: Not on file  Social Connections: Not on file  Depression (PHQ2-9): Low Risk (09/19/2023)   Depression (PHQ2-9)    PHQ-2 Score: 0  Alcohol Screen: Not on file  Housing: Low Risk  (03/07/2024)   Received from [facility]   Epic    In the last 12 months, was there a time when you were not able to pay the mortgage or rent on time?: No    In the past 12 months, how many times have you moved where you were living?: 0    At any time in the past 12 months, were you homeless or living in a shelter (including now)?: No  Utilities: Not At Risk (03/07/2024)   Received from [facility]   Epic    In the past 12 months has the electric, gas, oil, or water company threatened to shut off services in your home?: No  Health Literacy: Not on file     Obstetric History OB History     Gravida  2   Para  2   Term  2   Preterm      AB  Living  2      SAB      IAB      Ectopic      Multiple      Live Births  2            GYN/Menstrual History No LMP recorded. Patient is postmenopausal.  Last Pap: 06/20/21, NILM/HPV neg Contraception: Postmenopausal  Prevention Dentist Due 11/2024 Eye exam 01/2024 Mammogram Due 08/11/26 Colonoscopy Due 07/03/2033 Flu shot/vaccines NA  Current Medications Show/hide medication list[1]      ROS Constitutional: Denied constitutional symptoms, night sweats, recent illness, fatigue, fever, insomnia and weight loss.  Eyes: Denied eye symptoms, eye pain, photophobia, vision change and visual disturbance.  Ears/Nose/Throat/Neck: Denied ear, nose, throat or neck symptoms, hearing loss, nasal discharge, sinus congestion and sore throat.   Cardiovascular: Denied cardiovascular symptoms, arrhythmia, chest pain/pressure, edema, exercise intolerance, orthopnea and palpitations.  Respiratory: Denied pulmonary symptoms, asthma, pleuritic pain, productive sputum, cough, dyspnea and wheezing.  Gastrointestinal: Denied gastro-esophageal reflux, melena, nausea and vomiting.  Genitourinary:*** Denied genitourinary symptoms including symptomatic vaginal discharge, pelvic relaxation issues, and urinary complaints.  Musculoskeletal: Denied musculoskeletal symptoms, stiffness, swelling, muscle weakness and myalgia.  Dermatologic: Denied dermatology symptoms, rash and scar.  Neurologic: Denied neurology symptoms, dizziness, headache, neck pain and syncope.  Psychiatric: Denied psychiatric symptoms, anxiety and depression.  Endocrine: Denied endocrine symptoms including hot flashes and night sweats.    OBJECTIVE  There were no vitals taken for this visit.   Physical examination General NAD, Conversant  HEENT Atraumatic; Op clear with mmm.  Normo-cephalic. Pupils reactive. Anicteric sclerae  Thyroid/Neck Smooth without nodularity or enlargement. Normal ROM.  Neck Supple.  Skin No rashes, lesions or ulceration. Normal palpated skin turgor. No nodularity.  Breasts: No masses or discharge.  Symmetric.  No axillary adenopathy.  Lungs: Clear to auscultation.No rales or wheezes. Normal Respiratory effort, no retractions.  Heart: NSR.  No murmurs or rubs appreciated. No peripheral edema  Abdomen: Soft.  Non-tender.  No masses.  No HSM. No hernia  Extremities: Moves all appropriately.  Normal ROM for age. No lymphadenopathy.  Neuro: Oriented to PPT.  Normal mood. Normal affect.     Pelvic:   Vulva: Normal appearance.  No lesions.  Vagina: No lesions or abnormalities noted.  Support: Normal pelvic support.  Urethra No masses tenderness or scarring.  Meatus Normal size without lesions or prolapse.  Cervix: Normal appearance.  No lesions.   Anus: Normal exam.  No lesions.  Perineum: Normal exam.  No lesions.        Bimanual   Uterus: Normal size.  Non-tender.  Mobile.  AV.  Adnexae: No masses.  Non-tender to palpation.  Cul-de-sac: Negative for abnormality.    ASSESSMENT  1) Annual exam  PLAN 1) Physical exam as noted. Discussed healthy lifestyle choices and preventive care. 2) *** 3) Return in one year for annual exam or as needed for concerns.   Lauraine Lakes, CNM      [1]  Outpatient Medications Prior to Visit  Medication Sig   CALCIUM PO Take by mouth.   Multiple Vitamins-Minerals (ONE-A-DAY WOMENS 50+ PO) Take by mouth.   VITAMIN D PO Take by mouth.   No facility-administered medications prior to visit.   "

## 2024-09-19 NOTE — Progress Notes (Addendum)
 "  ANNUAL GYNECOLOGICAL EXAM  HPI  Michele Rodriguez is a 59 y.o.-year-old [id number] who presents for an annual gynecological exam today.  She denies pelvic pain, dyspareunia, abnormal vaginal bleeding or discharge, and UTI symptoms. Patient states she has no concerns today. Denies any postmenopausal symptoms.   Medical/Surgical History Past Medical History:  Diagnosis Date   Actinic keratosis    BRCA negative 05/2019   MyRisk neg; IBIS=8.2%/riskscore=8.0%   Family history of colon cancer    Family history of pancreatic cancer    Past Surgical History:  Procedure Laterality Date   COLONOSCOPY WITH PROPOFOL  N/A 05/14/2015   Procedure: COLONOSCOPY WITH PROPOFOL ;  Surgeon: Donnice Vaughn Manes, MD;  Location[facility] ENDOSCOPY;  Service: Endoscopy;  Laterality: N/A;   FOOT SURGERY Bilateral 1983   to align big toe, pt states it might been bunion surgery    KNEE ARTHROSCOPY Bilateral 1983   to align knee cap pt states   TUBAL LIGATION  1992    Social History Lives with Boyfriend. 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 Substances: Denies EtOH, tobacco, vape, and recreational drugs  Social Drivers of Health   Tobacco Use: Low Risk (09/19/2024)   Patient History    Smoking Tobacco Use: Never    Smokeless Tobacco Use: Never    Passive Exposure: Not on file  Financial Resource Strain: Low Risk  (03/07/2024)   Received from [facility]   Overall Financial Resource Strain (CARDIA)    Difficulty of Paying Living Expenses: Not hard at all  Food Insecurity: No Food Insecurity (09/19/2024)   Epic    Worried About Programme Researcher, Broadcasting/film/video in the Last Year: Never true    Ran Out of Food in the Last Year: Never true  Transportation Needs: No Transportation Needs (03/07/2024)   Received from [facility]   PRAPARE - Transportation    In the past 12 months, has lack of transportation kept you  from medical appointments or from getting medications?: No    Lack of Transportation (Non-Medical): No  Physical Activity: Insufficiently Active (09/19/2024)   Exercise Vital Sign    Days of Exercise per Week: 2 days    Minutes of Exercise per Session: 60 min  Stress: Not on file  Social Connections: Not on file  Depression (PHQ2-9): Low Risk (09/19/2024)   Depression (PHQ2-9)    PHQ-2 Score: 0  Alcohol Screen: Low Risk (09/19/2024)   Alcohol Screen    Last Alcohol Screening Score (AUDIT): 2  Housing: Low Risk (09/19/2024)   Epic    Unable to Pay for Housing in the Last Year: No    Number of Times Moved in the Last Year: 0    Homeless in the Last Year: No  Utilities: Not At Risk (09/19/2024)   Epic    Threatened with loss of utilities: No  Health Literacy: Adequate Health Literacy (09/19/2024)   B1300 Health Literacy    Frequency of need for help with medical instructions: Never     Obstetric History OB History     Gravida  2   Para  2   Term  2   Preterm      AB      Living  2      SAB      IAB      Ectopic      Multiple      Live Births  2            GYN/Menstrual History No LMP recorded. Patient is postmenopausal.  Last Pap: 06/20/21, NILM/HPV neg Contraception: Postmenopausal  Prevention Dentist Due 11/2024 Eye exam 01/2024 Mammogram Due 08/11/26 Colonoscopy Due 07/03/2033 Flu shot/vaccines declined  Current Medications Show/hide medication list[1]      ROS Constitutional: Denied constitutional symptoms, night sweats, recent illness, fatigue, fever, insomnia and weight loss.  Eyes: Denied eye symptoms, eye pain, photophobia, vision change and visual disturbance.  Ears/Nose/Throat/Neck: Denied ear, nose, throat or neck symptoms, hearing loss, nasal discharge, sinus congestion and sore throat.  Cardiovascular: Denied cardiovascular symptoms, arrhythmia, chest pain/pressure, edema, exercise intolerance, orthopnea and palpitations.   Respiratory: Denied pulmonary symptoms, asthma, pleuritic pain, productive sputum, cough, dyspnea and wheezing.  Gastrointestinal: Denied gastro-esophageal reflux, melena, nausea and vomiting.  Genitourinary: Denied genitourinary symptoms including symptomatic vaginal discharge, pelvic relaxation issues, and urinary complaints.  Musculoskeletal: Denied musculoskeletal symptoms, stiffness, swelling, muscle weakness and myalgia.  Dermatologic: Denied dermatology symptoms, rash and scar.  Neurologic: Denied neurology symptoms, dizziness, headache, neck pain and syncope.  Psychiatric: Denied psychiatric symptoms, anxiety and depression.  Endocrine: Denied endocrine symptoms including hot flashes and night sweats.    OBJECTIVE  BP 107/72   Pulse (!) 57   Ht 5' 4 (1.626 m)   Wt 71.9 kg   BMI 27.21 kg/m    Physical examination General NAD, Conversant  HEENT Atraumatic; Op clear with mmm.  Normo-cephalic. Pupils reactive. Anicteric sclerae  Thyroid/Neck Smooth without nodularity or enlargement. Normal ROM.  Neck Supple.  Skin No rashes, lesions or ulceration. Normal palpated skin turgor. No nodularity.  Breasts: No masses or discharge.  Symmetric.  No axillary adenopathy.  Lungs: Clear to auscultation.No rales or wheezes. Normal Respiratory effort, no retractions.  Heart: NSR.  No murmurs or rubs appreciated. No peripheral edema  Abdomen: Soft.  Non-tender.  No masses.  No HSM. No hernia  Extremities: Moves all appropriately.  Normal ROM for age. No lymphadenopathy.  Neuro: Oriented to PPT.  Normal mood. Normal affect.     Pelvic: Deferred   Vulva:   Vagina:   Support:   Urethra   Meatus   Cervix:   Anus:   Perineum:         Bimanual Deferred   Uterus:   Adnexae:   Cul-de-sac:     ASSESSMENT  1) Annual exam  PLAN 1) Physical exam as noted. Discussed continuing healthy lifestyle choices and preventive care. 3) Return in one year for annual exam or as needed for concerns.     [name], SNM       [1]  Outpatient Medications Prior to Visit  Medication Sig   CALCIUM PO Take by mouth.   Multiple Vitamins-Minerals (ONE-A-DAY WOMENS 50+ PO) Take by mouth.   VITAMIN D PO Take by mouth.   No facility-administered medications prior to visit.   "

## 2024-09-19 NOTE — Patient Instructions (Signed)
 Preventive Care 58-60 Years Old, Female  Preventive care refers to lifestyle choices and visits with your health care provider that can promote health and wellness. Preventive care visits are also called wellness exams.  What can I expect for my preventive care visit?  Counseling  Your health care provider may ask you questions about your:  Medical history, including:  Past medical problems.  Family medical history.  Pregnancy history.  Current health, including:  Menstrual cycle.  Method of birth control.  Emotional well-being.  Home life and relationship well-being.  Sexual activity and sexual health.  Lifestyle, including:  Alcohol, nicotine or tobacco, and drug use.  Access to firearms.  Diet, exercise, and sleep habits.  Work and work Astron[employer].  Sunscreen use.  Safety issues such as seatbelt and bike helmet use.  Physical exam  Your health care provider will check your:  Height and weight. These may be used to calculate your BMI (body mass index). BMI is a measurement that tells if you are at a healthy weight.  Waist circumference. This measures the distance around your waistline. This measurement also tells if you are at a healthy weight and may help predict your risk of certain diseases, such as type 2 diabetes and high blood pressure.  Heart rate and blood pressure.  Body temperature.  Skin for abnormal spots.  What immunizations do I need?    Vaccines are usually given at various ages, according to a schedule. Your health care provider will recommend vaccines for you based on your age, medical history, and lifestyle or other factors, such as travel or where you work.  What tests do I need?  Screening  Your health care provider may recommend screening tests for certain conditions. This may include:  Lipid and cholesterol levels.  Diabetes screening. This is done by checking your blood sugar (glucose) after you have not eaten for a while (fasting).  Pelvic exam and Pap test.  Hepatitis B test.  Hepatitis C  test.  HIV (human immunodeficiency virus) test.  STI (sexually transmitted infection) testing, if you are at risk.  Lung cancer screening.  Colorectal cancer screening.  Mammogram. Talk with your health care provider about when you should start having regular mammograms. This may depend on whether you have a family history of breast cancer.  BRCA-related cancer screening. This may be done if you have a family history of breast, ovarian, tubal, or peritoneal cancers.  Bone density scan. This is done to screen for osteoporosis.  Talk with your health care provider about your test results, treatment options, and if necessary, the need for more tests.  Follow these instructions at home:  Eating and drinking    Eat a diet that includes fresh fruits and vegetables, whole grains, lean protein, and low-fat dairy products.  Take vitamin and mineral supplements as recommended by your health care provider.  Do not drink alcohol if:  Your health care provider tells you not to drink.  You are pregnant, may be pregnant, or are planning to become pregnant.  If you drink alcohol:  Limit how much you have to 0-1 drink a day.  Know how much alcohol is in your drink. In the [place] one drink equals one 12 oz bottle of beer (355 mL), one 5 oz glass of wine (148 mL), or one 1 oz glass of hard liquor (44 mL).  Lifestyle  Brush your teeth every morning and night with fluoride toothpaste. Floss one time each day.  Exercise for at least  30 minutes 5 or more days each week.  Do not use any products that contain nicotine or tobacco. These products include cigarettes, chewing tobacco, and vaping devices, such as e-cigarettes. If you need help quitting, ask your health care provider.  Do not use drugs.  If you are sexually active, practice safe sex. Use a condom or other form of protection to prevent STIs.  If you do not wish to become pregnant, use a form of birth control. If you plan to become pregnant, see your health care provider for a  prepregnancy visit.  Take aspirin only as told by your health care provider. Make sure that you understand how much to take and what form to take. Work with your health care provider to find out whether it is safe and beneficial for you to take aspirin daily.  Find healthy ways to manage stress, such as:  Meditation, yoga, or listening to music.  Journaling.  Talking to a trusted person.  Spending time with friends and family.  Minimize exposure to UV radiation to reduce your risk of skin cancer.  Safety  Always wear your seat belt while driving or riding in a vehicle.  Do not drive:  If you have been drinking alcohol. Do not ride with someone who has been drinking.  When you are tired or distracted.  While texting.  If you have been using any mind-altering substances or drugs.  Wear a helmet and other protective equipment during sports activities.  If you have firearms in your house, make sure you follow all gun safety procedures.  Seek help if you have been physically or sexually abused.  What's next?  Visit your health care provider once a year for an annual wellness visit.  Ask your health care provider how often you should have your eyes and teeth checked.  Stay up to date on all vaccines.  This information is not intended to replace advice given to you by your health care provider. Make sure you discuss any questions you have with your health care provider.  Document Revised: 02/09/2021 Document Reviewed: 02/09/2021  Elsevier Patient Education  2024 [employer].

## 2024-12-11 ENCOUNTER — Ambulatory Visit: Admitting: Dermatology
